# Patient Record
Sex: Female | Born: 1960 | Hispanic: Refuse to answer | Marital: Married | Smoking: Never smoker
Health system: Southern US, Community
[De-identification: ages and names within clinical notes are randomized; demographics above are authoritative.]

## PROBLEM LIST (undated history)

## (undated) DIAGNOSIS — L509 Urticaria, unspecified: Secondary | ICD-10-CM

## (undated) DIAGNOSIS — I700 Atherosclerosis of aorta: Secondary | ICD-10-CM

## (undated) DIAGNOSIS — R002 Palpitations: Secondary | ICD-10-CM

## (undated) DIAGNOSIS — R252 Cramp and spasm: Secondary | ICD-10-CM

## (undated) DIAGNOSIS — K760 Fatty (change of) liver, not elsewhere classified: Secondary | ICD-10-CM

## (undated) DIAGNOSIS — K279 Peptic ulcer, site unspecified, unspecified as acute or chronic, without hemorrhage or perforation: Secondary | ICD-10-CM

## (undated) DIAGNOSIS — I10 Essential (primary) hypertension: Secondary | ICD-10-CM

## (undated) HISTORY — DX: Fatty (change of) liver, not elsewhere classified: K76.0

## (undated) HISTORY — DX: Palpitations: R00.2

## (undated) HISTORY — DX: Atherosclerosis of aorta: I70.0

## (undated) HISTORY — DX: Urticaria, unspecified: L50.9

## (undated) HISTORY — DX: Essential (primary) hypertension: I10

## (undated) HISTORY — PX: TUBAL LIGATION: SHX77

## (undated) HISTORY — DX: Peptic ulcer, site unspecified, unspecified as acute or chronic, without hemorrhage or perforation: K27.9

## (undated) HISTORY — DX: Cramp and spasm: R25.2

---

## 2016-06-29 DIAGNOSIS — B029 Zoster without complications: Secondary | ICD-10-CM | POA: Insufficient documentation

## 2016-06-29 HISTORY — DX: Zoster without complications: B02.9

## 2020-04-20 ENCOUNTER — Other Ambulatory Visit: Payer: Self-pay | Admitting: Family

## 2020-04-20 DIAGNOSIS — I10 Essential (primary) hypertension: Secondary | ICD-10-CM

## 2020-04-20 DIAGNOSIS — Z8249 Family history of ischemic heart disease and other diseases of the circulatory system: Secondary | ICD-10-CM

## 2020-09-12 ENCOUNTER — Ambulatory Visit (INDEPENDENT_AMBULATORY_CARE_PROVIDER_SITE_OTHER): Payer: BC Managed Care – PPO | Admitting: Allergy and Immunology

## 2020-09-12 ENCOUNTER — Encounter: Payer: Self-pay | Admitting: Allergy and Immunology

## 2020-09-12 ENCOUNTER — Other Ambulatory Visit: Payer: Self-pay

## 2020-09-12 VITALS — BP 152/90 | HR 91 | Resp 18 | Ht 67.0 in | Wt 239.0 lb

## 2020-09-12 DIAGNOSIS — J3089 Other allergic rhinitis: Secondary | ICD-10-CM | POA: Diagnosis not present

## 2020-09-12 DIAGNOSIS — R0602 Shortness of breath: Secondary | ICD-10-CM

## 2020-09-12 DIAGNOSIS — J301 Allergic rhinitis due to pollen: Secondary | ICD-10-CM | POA: Diagnosis not present

## 2020-09-12 NOTE — Progress Notes (Signed)
[place] - [place] - [place] - [place] - [place]   Dear Dr. Sudie Bailey,  Thank you for referring Whitney Roberts to the [facility] [place] on 09/12/2020.   Below is a summation of this patient's evaluation and recommendations.  Thank you for your referral. I will keep you informed about this patient's response to treatment.   If you have any questions please do not hesitate to contact me.   Sincerely,  Jessica Priest, MD Allergy / Immunology [facility]   ______________________________________________________________________    NEW PATIENT NOTE  Referring Provider: Philemon Kingdom, MD Primary Provider: Philemon Kingdom, MD Date of office visit: 09/12/2020    Subjective:   Chief Complaint:  Whitney Roberts (DOB: 1961-05-09) is a 59 y.o. female who presents to the clinic on 09/12/2020 with a chief complaint of Allergic Rhinitis  .     HPI: Katya presents to this clinic in evaluation of allergic disease.  She has a multiyear history of having spring and fall problems with nasal congestion and sneezing and ears clogged up and runny nose that appears to occur when locating to the outdoors and inhaling pollen.  This year her pattern changed and she developed very significant problems throughout most of the year requiring the administration of 3 systemic steroids.  Every time she received a systemic steroid it did appear to work well but then she would relapse within a short period in time.  She was given Dymista but she only uses these medications when she develops a flareup.  She was given montelukast and developed CNS side effects including panic attacks and ringing in her ears.  In addition, she developed shortness of breath this spring after having lots of pollen exposure.  She has never had this problem before and has never had this problem since.  She can have cold air  exposure with no difficulty and she can exercise without any difficulty.  She had an echocardiogram performed at that point in time which was apparently normal.  She contracted Covid in April 2020 without any long-term sequela and has received 3 Moderna Covid vaccines.  Past Medical History:  Diagnosis Date  . Urticaria     Past Surgical History:  Procedure Laterality Date  . TUBAL LIGATION      Allergies as of 09/12/2020   Not on File     Medication List      Azelastine-Fluticasone 137-50 MCG/ACT Susp Place 1 spray into both nostrils 2 (two) times daily.   olmesartan 40 MG tablet Commonly known as: BENICAR Take 40 mg by mouth daily.   omeprazole 20 MG capsule Commonly known as: PRILOSEC Take 20 mg by mouth 2 (two) times daily.       Review of systems negative except as noted in HPI / PMHx or noted below:  Review of Systems  Constitutional: Negative.   HENT: Negative.   Eyes: Negative.   Respiratory: Negative.   Cardiovascular: Negative.   Gastrointestinal: Negative.   Genitourinary: Negative.   Musculoskeletal: Negative.   Skin: Negative.   Neurological: Negative.   Endo/Heme/Allergies: Negative.   Psychiatric/Behavioral: Negative.     Family History  Problem Relation Age of Onset  . Heart disease Mother   . COPD Father     Social History   Socioeconomic History  . Marital status: Unknown    Spouse name: Not on file  . Number of children: Not on file  .  Years of education: Not on file  . Highest education level: Not on file  Occupational History  . Not on file  Tobacco Use  . Smoking status: Not on file  Substance and Sexual Activity  . Alcohol use: Not on file  . Drug use: Not on file  . Sexual activity: Not on file  Other Topics Concern  . Not on file  Social History Narrative  . Not on file    Environmental and Social history  Lives in a house with a dry environment, no animals located inside the household, hardwood in the bedroom  floor, plastic on the bed, no plastic on the pillow, no smoking ongoing with inside the household.      Objective:   Vitals:   09/12/20 0923  BP: (!) 152/90  Pulse: 91  Resp: 18  SpO2: 98%   Height: 5\' 7"  (170.2 cm) Weight: 239 lb (108.4 kg)  Physical Exam Constitutional:      Appearance: She is not diaphoretic.  HENT:     Head: Normocephalic.     Right Ear: Tympanic membrane, ear canal and external ear normal.     Left Ear: Tympanic membrane, ear canal and external ear normal.     Nose: Nose normal. No mucosal edema or rhinorrhea.     Mouth/Throat:     Pharynx: Uvula midline. No oropharyngeal exudate.  Eyes:     Conjunctiva/sclera: Conjunctivae normal.  Neck:     Thyroid: No thyromegaly.     Trachea: Trachea normal. No tracheal tenderness or tracheal deviation.  Cardiovascular:     Rate and Rhythm: Normal rate and regular rhythm.     Heart sounds: Normal heart sounds, S1 normal and S2 normal. No murmur heard.   Pulmonary:     Effort: No respiratory distress.     Breath sounds: Normal breath sounds. No stridor. No wheezing or rales.  Lymphadenopathy:     Head:     Right side of head: No tonsillar adenopathy.     Left side of head: No tonsillar adenopathy.     Cervical: No cervical adenopathy.  Skin:    Findings: No erythema or rash.     Nails: There is no clubbing.  Neurological:     Mental Status: She is alert.     Diagnostics: Allergy skin tests were performed.  She demonstrated hypersensitivity to house dust mite, cat, dog, and springtime pollen including cedar tree.  Spirometry was performed and demonstrated an FEV1 of 2.42 @ 102 % of predicted. FEV1/FVC = 0.90  The patient had an Asthma Control Test with the following results:  .    Results of an echocardiogram obtained 09 February 2020 identified the following:  1. The left ventricle is normal in size with mildly to moderately increased wall thickness. 2. The left ventricular systolic function is  normal, LVEF is visually estimated at 60-65%. 3. The right ventricle is normal in size, with normal systolic function. 4. There is no pulmonary hypertension, estimated pulmonary artery systolic pressure is 27 mmHg.   Assessment and Plan:    1. Perennial allergic rhinitis   2. Seasonal allergic rhinitis due to pollen   3. Shortness of breath     1.  Allergen avoidance measures - dust mite, cat, dog, pollen  2.  Use Dymista 1 spray each nostril 1-2 times per day.  Can stretch out interval of use to 3 times per week.  Works best 11 February 2020.  3.  If needed:   A.  Nasal  saline  B.  Cetirizine 10 mg - 1 tablet 1 time per day  4.  Further evaluation for shortness of breath???  Yes, if recurrent  5.  Obtain fall flu vaccine  6.  Return to clinic in 6 months or earlier if problem  Autry appears to have atopic disease giving rise to her respiratory tract issues and will have her perform allergen avoidance measures best as possible and use some anti-inflammatory agents for her respiratory tract in a preventative manner.  She has had an episode of shortness of breath this past spring and she will require further evaluation for this issue if this is recurrent.  If she does well I will see her back in this clinic in 6 months which will be right in the middle of the springtime or earlier if there is a problem.  If she fails medical therapy she would be a candidate for immunotherapy.  Jessica Priest, MD Allergy / Immunology [facility] [place]

## 2020-09-12 NOTE — Patient Instructions (Addendum)
  1.  Allergen avoidance measures - dust mite, cat, dog, pollen  2.  Use Dymista 1 spray each nostril 1-2 times per day.  Can stretch out interval of use to 3 times per week.  Works best Water quality scientist.  3.  If needed:   A.  Nasal saline  B.  Cetirizine 10 mg - 1 tablet 1 time per day  4.  Further evaluation for shortness of breath???  Yes, if recurrent  5.  Obtain fall flu vaccine  6.  Return to clinic in 6 months or earlier if problem

## 2020-09-13 ENCOUNTER — Encounter: Payer: Self-pay | Admitting: Allergy and Immunology

## 2020-10-19 ENCOUNTER — Encounter: Payer: Self-pay | Admitting: *Deleted

## 2021-05-03 ENCOUNTER — Ambulatory Visit: Payer: BC Managed Care – PPO | Admitting: Allergy and Immunology

## 2021-05-03 ENCOUNTER — Encounter: Payer: Self-pay | Admitting: Allergy and Immunology

## 2021-05-03 ENCOUNTER — Other Ambulatory Visit: Payer: Self-pay

## 2021-05-03 VITALS — BP 128/98 | HR 84 | Resp 16 | Ht 66.0 in | Wt 238.0 lb

## 2021-05-03 DIAGNOSIS — J3089 Other allergic rhinitis: Secondary | ICD-10-CM | POA: Diagnosis not present

## 2021-05-03 DIAGNOSIS — H6982 Other specified disorders of Eustachian tube, left ear: Secondary | ICD-10-CM | POA: Diagnosis not present

## 2021-05-03 DIAGNOSIS — K219 Gastro-esophageal reflux disease without esophagitis: Secondary | ICD-10-CM

## 2021-05-03 DIAGNOSIS — J301 Allergic rhinitis due to pollen: Secondary | ICD-10-CM

## 2021-05-03 MED ORDER — OMEPRAZOLE 40 MG PO CPDR: DELAYED_RELEASE_CAPSULE | ORAL | 5 refills | Status: DC

## 2021-05-03 MED ORDER — FAMOTIDINE 40 MG PO TABS: 40.0000 mg | ORAL_TABLET | Freq: Every day | ORAL | 5 refills | Status: DC

## 2021-05-03 NOTE — Progress Notes (Signed)
[place] - [place] - [place] - [place] - [place]   Follow-up Note  Referring Provider: Philemon Kingdom, MD Primary Provider: Philemon Kingdom, MD Date of Office Visit: 05/03/2021  Subjective:   Whitney Roberts (DOB: 1960/11/14) is a 60 y.o. female who [facility] the [facility] on 05/03/2021 in re-evaluation of the following:  HPI: Whitney Roberts returns to this clinic in reevaluation of allergic rhinitis and a history of intermittent shortness of breath.  Her last visit to this clinic was her initial evaluation of 12 September 2020.  Overall she thought that she was doing very well regarding all of her respiratory tract issues but apparently she developed a side effect from the use of Dymista with a tachycardia and she discontinued this agent in March and relied on the use of Claritin twice a day.  She continued to do well but sometime at the end of May she developed a ear problem.  She feels as though her left ear was stopped up intermittently and she hears some popping on occasion.  She does not have any associated hearing loss but she does have a history of having vertigo for the past 2 years although fortunately she has not had any for at least the past year.  Apparently she had this issue very early in January 2022 and received a "steroid shot" which resolved this issue.  In addition, she has been having lots of intermittent raspy voice and throat clearing.  This also started about the end of May.  She does have reflux disease and she is treating this with omeprazole 20 mg twice a day.  She does not consume any caffeine or chocolate.  Her issues of intermittent shortness of breath that were occurring last year have not returned.  Allergies as of 05/03/2021       Reactions   Penicillins Hives   Aspirin Other (See Comments)   Other reaction(s): GI Bleed "bleeding ulcers'   Dymista [azelastine-fluticasone] Other (See Comments)   Increased heart rate    Montelukast Other (See Comments)   Ringing in ears   Prednisone Other (See Comments)   Trouble thinking        Medication List    CREAM BASE EX Apply topically. Bi-Est/Pro (50/50) 1/100mg /gm Cream   olmesartan 40 MG tablet Commonly known as: BENICAR Take 40 mg by mouth daily.   omeprazole 20 MG capsule Commonly known as: PRILOSEC Take 20 mg by mouth 2 (two) times daily.   VITAMIN C PO Take by mouth daily.   VITAMIN D3 PO Take by mouth daily.        Past Medical History:  Diagnosis Date   Urticaria     Past Surgical History:  Procedure Laterality Date   TUBAL LIGATION      Review of systems negative except as noted in HPI / PMHx or noted below:  Review of Systems  Constitutional: Negative.   HENT: Negative.    Eyes: Negative.   Respiratory: Negative.    Cardiovascular: Negative.   Gastrointestinal: Negative.   Genitourinary: Negative.   Musculoskeletal: Negative.   Skin: Negative.   Neurological: Negative.   Endo/Heme/Allergies: Negative.   Psychiatric/Behavioral: Negative.      Objective:   Vitals:   05/03/21 0823 05/03/21 0841  BP: (!) 164/94 (!) 128/98  Pulse:    Resp:    SpO2:     Height: 5\' 6"  (167.6 cm)  Weight: 238 lb (108 kg)   Physical Exam Constitutional:  Appearance: She is not diaphoretic.     Comments: Raspy voice, throat clearing  HENT:     Head: Normocephalic.     Right Ear: Tympanic membrane, ear canal and external ear normal.     Left Ear: Tympanic membrane, ear canal and external ear normal. Tympanic membrane has normal mobility.     Nose: Nose normal. No mucosal edema or rhinorrhea.     Mouth/Throat:     Pharynx: Uvula midline. No oropharyngeal exudate.  Eyes:     Conjunctiva/sclera: Conjunctivae normal.  Neck:     Thyroid: No thyromegaly.     Trachea: Trachea normal. No tracheal tenderness or tracheal deviation.  Cardiovascular:     Rate and Rhythm: Normal rate and regular rhythm.     Heart sounds: Normal  heart sounds, S1 normal and S2 normal. No murmur heard. Pulmonary:     Effort: No respiratory distress.     Breath sounds: Normal breath sounds. No stridor. No wheezing or rales.  Lymphadenopathy:     Head:     Right side of head: No tonsillar adenopathy.     Left side of head: No tonsillar adenopathy.     Cervical: No cervical adenopathy.  Skin:    Findings: No erythema or rash.     Nails: There is no clubbing.  Neurological:     Mental Status: She is alert.    Diagnostics: none  Assessment and Plan:   1. Perennial allergic rhinitis   2. Seasonal allergic rhinitis due to pollen   3. LPRD (laryngopharyngeal reflux disease)   4. Dysfunction of left eustachian tube     1.  Allergen avoidance measures - dust mite, cat, dog, pollen  2.  Use NASACORT 1 spray each nostril 1-2 times per day.  Can stretch out interval of use to 3 times per week.  Works best Water quality scientist.  3.  If needed:   A.  Nasal saline  B.  Cetirizine or loratadine 10 mg - 1 tablet 1-2 times per day  C.  Positive pressure inflation of ear  4.  Treat reflux / LPR:   A. INCREASE omeprazole 40 mg - 2 times per day  B. START famotidine 40 mg - 1 tablet in evening  C. Replace throat clearing with swallowing maneuver  5. Return to clinic in 1 month or earlier if problem  Whitney Roberts appears to have some inflammation of her airway giving rise to ETD and some laryngeal problems.  This is most likely on the basis of her reflux and will treat her for LPR by increasing her omeprazole dose and starting her on famotidine and have her eliminate throat clearing with a swallowing maneuver.  I will regroup with her in 1 month to assess her response to this approach.  For her ETD, which is probably triggered by her reflux disease, she can perform positive pressure inflation of her ear should it be required.  I recommended that she use some dose of nasal steroid even if it is just a few times per week given her atopic disease.   Fortunately, most of her atopic disease actually appears to be under really good control at this point.  [name], MD Allergy / Immunology [facility]

## 2021-05-03 NOTE — Patient Instructions (Addendum)
  1.  Allergen avoidance measures - dust mite, cat, dog, pollen  2.  Use NASACORT 1 spray each nostril 1-2 times per day.  Can stretch out interval of use to 3 times per week.  Works best Water quality scientist.  3.  If needed:   A.  Nasal saline  B.  Cetirizine or loratadine 10 mg - 1 tablet 1-2 times per day  C.  Positive pressure inflation of ear  4.  Treat reflux / LPR:   A. INCREASE omeprazole 40 mg - 2 times per day  B. START famotidine 40 mg - 1 tablet in evening  C. Replace throat clearing with swallowing maneuver  5. Return to clinic in 1 month or earlier if problem

## 2021-05-04 ENCOUNTER — Encounter: Payer: Self-pay | Admitting: Allergy and Immunology

## 2021-06-08 ENCOUNTER — Ambulatory Visit: Payer: BC Managed Care – PPO | Admitting: Allergy and Immunology

## 2021-06-08 ENCOUNTER — Other Ambulatory Visit: Payer: Self-pay

## 2021-06-08 ENCOUNTER — Encounter: Payer: Self-pay | Admitting: Allergy and Immunology

## 2021-06-08 VITALS — BP 118/72 | HR 83 | Resp 16

## 2021-06-08 DIAGNOSIS — H6982 Other specified disorders of Eustachian tube, left ear: Secondary | ICD-10-CM

## 2021-06-08 DIAGNOSIS — J301 Allergic rhinitis due to pollen: Secondary | ICD-10-CM

## 2021-06-08 DIAGNOSIS — K219 Gastro-esophageal reflux disease without esophagitis: Secondary | ICD-10-CM | POA: Diagnosis not present

## 2021-06-08 DIAGNOSIS — J3089 Other allergic rhinitis: Secondary | ICD-10-CM | POA: Diagnosis not present

## 2021-06-08 NOTE — Progress Notes (Signed)
[place] - [place] - [place] - [place] - [place]   Follow-up Note  Referring Provider: Philemon Kingdom, MD Primary Provider: Philemon Kingdom, MD Date of Office Visit: 06/08/2021  Subjective:   Whitney Roberts (DOB: [birth year]) is a 60 y.o. female who returns to the [facility] on 06/08/2021 in re-evaluation of the following:  HPI: Franca returns to this clinic in evaluation of allergic rhinitis and ETD and LPR.  Her last visit to this clinic was 03 May 2021.  During her last visit we focused on the treatment of LPR and she has significantly improved regarding her raspy voice and throat clearing and overall her throat just feels a lot better.  She has been consistently using a proton pump inhibitor and H2 receptor blocker and she has worked very hard to replace her throat clearing maneuvers with swallowing maneuvers.  Her ear is doing very well at this point in time.  Her airway is doing very well at this point in time.  She has been consistently using Nasacort a few times per week.  Allergies as of 06/08/2021       Reactions   Penicillins Hives   Aspirin Other (See Comments)   Other reaction(s): GI Bleed "bleeding ulcers'   Dymista [azelastine-fluticasone] Other (See Comments)   Increased heart rate   Montelukast Other (See Comments)   Ringing in ears   Prednisone Other (See Comments)   Trouble thinking        Medication List    CREAM BASE EX Apply topically. Bi-Est/Pro (50/50) 1/100mg /gm Cream   famotidine 40 MG tablet Commonly known as: PEPCID Take 1 tablet (40 mg total) by mouth at bedtime.   olmesartan 40 MG tablet Commonly known as: BENICAR Take 40 mg by mouth daily.   omeprazole 40 MG capsule Commonly known as: PRILOSEC Take one capsule by mouth twice daily as directed.   VITAMIN C PO Take by mouth daily.   VITAMIN D3 PO Take by mouth daily.    Past Medical History:  Diagnosis Date   Urticaria     Past  Surgical History:  Procedure Laterality Date   TUBAL LIGATION      Review of systems negative except as noted in HPI / PMHx or noted below:  Review of Systems  Constitutional: Negative.   HENT: Negative.    Eyes: Negative.   Respiratory: Negative.    Cardiovascular: Negative.   Gastrointestinal: Negative.   Genitourinary: Negative.   Musculoskeletal: Negative.   Skin: Negative.   Neurological: Negative.   Endo/Heme/Allergies: Negative.   Psychiatric/Behavioral: Negative.      Objective:   Vitals:   06/08/21 0847  BP: 118/72  Pulse: 83  Resp: 16  SpO2: 98%          Physical Exam Constitutional:      Appearance: She is not diaphoretic.  HENT:     Head: Normocephalic.     Right Ear: Tympanic membrane, ear canal and external ear normal.     Left Ear: Tympanic membrane, ear canal and external ear normal.     Nose: Nose normal. No mucosal edema or rhinorrhea.     Mouth/Throat:     Pharynx: Uvula midline. No oropharyngeal exudate.  Eyes:     Conjunctiva/sclera: Conjunctivae normal.  Neck:     Thyroid: No thyromegaly.     Trachea: Trachea normal. No tracheal tenderness or tracheal deviation.  Cardiovascular:     Rate and Rhythm: Normal rate and regular rhythm.  Heart sounds: Normal heart sounds, S1 normal and S2 normal. No murmur heard. Pulmonary:     Effort: No respiratory distress.     Breath sounds: Normal breath sounds. No stridor. No wheezing or rales.  Lymphadenopathy:     Head:     Right side of head: No tonsillar adenopathy.     Left side of head: No tonsillar adenopathy.     Cervical: No cervical adenopathy.  Skin:    Findings: No erythema or rash.     Nails: There is no clubbing.  Neurological:     Mental Status: She is alert.    Diagnostics: none  Assessment and Plan:   1. Perennial allergic rhinitis   2. Seasonal allergic rhinitis due to pollen   3. LPRD (laryngopharyngeal reflux disease)   4. Dysfunction of left eustachian tube      1.  Continue Allergen avoidance measures - dust mite, cat, dog, pollen  2.  Continue NASACORT 1 spray each nostril 3-7 times per week  3.  Continue to Treat reflux / LPR:   A. Omeprazole 40 mg - 2 times per day  B. Famotidine 40 mg - 1 tablet in evening  C. Replace throat clearing with swallowing maneuver  4. If needed:   A.  Nasal saline  B.  Cetirizine or loratadine 10 mg - 1 tablet 1-2 times per day  C.  Positive pressure inflation of ear  5. Return to clinic in December or earlier if problem. Taper medications???  6. Obtain fall flu vaccine  Kamile appears to be doing much better at this point in time while she utilizes therapy directed against respiratory tract and eustachian tube inflammation and also addresses her reflux induced respiratory disease.  We will keep her on the plan noted above until we see her back in this clinic in December and if she continues to do this well I think will be an opportunity to consolidate some of her treatment at that point.  [name], MD Allergy / Immunology [facility]

## 2021-06-08 NOTE — Patient Instructions (Addendum)
  1.  Continue Allergen avoidance measures - dust mite, cat, dog, pollen  2.  Continue NASACORT 1 spray each nostril 3-7 times per week  3.  Continue to Treat reflux / LPR:   A. Omeprazole 40 mg - 2 times per day  B. Famotidine 40 mg - 1 tablet in evening  C. Replace throat clearing with swallowing maneuver  4. If needed:   A.  Nasal saline  B.  Cetirizine or loratadine 10 mg - 1 tablet 1-2 times per day  C.  Positive pressure inflation of ear  5. Return to clinic in December or earlier if problem. Taper medications???  6. Obtain fall flu vaccine

## 2021-06-12 ENCOUNTER — Encounter: Payer: Self-pay | Admitting: Allergy and Immunology

## 2021-07-17 ENCOUNTER — Telehealth: Payer: Self-pay | Admitting: Allergy and Immunology

## 2021-07-17 NOTE — Telephone Encounter (Signed)
Patient states ever since she started to take Famotidine she's felt really bad. She gets severe ringing in her ears, feels very nervous like an anxiety attack is about to come on, nauseous, and can't sleep at night. She knows these symptoms are coming from this medication because when she doesn't take it she feels fine. She wanted to let Dr. Lucie Leather know of her symptoms and what he suggests before she stops taking it completely.

## 2021-07-17 NOTE — Telephone Encounter (Signed)
Please let Whitney Roberts know that she can either use half a tablet or stop famotidine completely.  We do not want to create another problem with her therapy for LPR.

## 2021-07-17 NOTE — Telephone Encounter (Signed)
Please advice Dr. Lucie Leather

## 2021-07-18 NOTE — Telephone Encounter (Signed)
She will completely stop Famotidine but continue her Omeprazole twice a day.

## 2021-07-18 NOTE — Telephone Encounter (Signed)
Informed patient of Dr. Kathyrn Lass message. Patient verbalized understanding.

## 2021-07-18 NOTE — Telephone Encounter (Signed)
Left a message for return call.  

## 2021-10-09 ENCOUNTER — Ambulatory Visit: Payer: BC Managed Care – PPO | Admitting: Allergy and Immunology

## 2021-10-09 ENCOUNTER — Other Ambulatory Visit: Payer: Self-pay

## 2021-10-09 ENCOUNTER — Encounter: Payer: Self-pay | Admitting: Allergy and Immunology

## 2021-10-09 VITALS — BP 122/78 | HR 80 | Resp 12

## 2021-10-09 DIAGNOSIS — K219 Gastro-esophageal reflux disease without esophagitis: Secondary | ICD-10-CM

## 2021-10-09 DIAGNOSIS — J3089 Other allergic rhinitis: Secondary | ICD-10-CM | POA: Diagnosis not present

## 2021-10-09 DIAGNOSIS — J301 Allergic rhinitis due to pollen: Secondary | ICD-10-CM

## 2021-10-09 DIAGNOSIS — H6982 Other specified disorders of Eustachian tube, left ear: Secondary | ICD-10-CM | POA: Diagnosis not present

## 2021-10-09 NOTE — Progress Notes (Signed)
[place] [facility] [place] [facility] [place] [facility] [place] [facility]   Follow-up Note  Referring Provider: Philemon Kingdom, MD Primary Provider: Philemon Kingdom, MD Date of Office Visit: 10/09/2021  Subjective:   Whitney Roberts (DOB: 1960/12/09) is a 60 y.o. female who [facility] in re-evaluation of the following:  HPI: Whitney Roberts returns to this clinic in evaluation of LPR, allergic rhinitis, and ETD.  Her last visit to this clinic was 08 June 2021.  Once again she has had excellent control of her LPR and has basically resolved all of her guttural maneuvers and cough.  She was able to taper off her H2 receptor blocker over the course of the past month and still does well.  She continues to use omeprazole consistently.  She has lost 8 pounds of weight.  She has had very little problems with her nose while using a nasal steroid.  Sometimes the Nasacort does cause her headache.  She has been using Nasacort a few times per week.  Her ear is doing okay.  About 3 times per week she still has 5 minutes of crackling noise in her left ear.  She does not have any associated hearing loss and dizziness.  This pattern has been stable for 15 years at which point in time she did visit with an ENT doctor.  Allergies as of 10/09/2021       Reactions   Penicillins Hives   Aspirin Other (See Comments)   Other reaction(s): GI Bleed "bleeding ulcers'   Dymista [azelastine-fluticasone] Other (See Comments)   Increased heart rate   Montelukast Other (See Comments)   Ringing in ears   Prednisone Other (See Comments)   Trouble thinking        Medication List    CREAM BASE EX Apply topically. Bi-Est/Pro (50/50) 1/100mg /gm Cream   olmesartan 40 MG tablet Commonly known as: BENICAR Take 40 mg by mouth daily.   omeprazole 40 MG capsule Commonly known as: PRILOSEC Take one capsule by mouth twice daily as directed.   PROBIOTIC DAILY PO Take by  mouth daily.   VITAMIN C PO Take by mouth daily.   VITAMIN D3 PO Take by mouth daily.    Past Medical History:  Diagnosis Date   Urticaria     Past Surgical History:  Procedure Laterality Date   TUBAL LIGATION      Review of systems negative except as noted in HPI / PMHx or noted below:  Review of Systems  Constitutional: Negative.   HENT: Negative.    Eyes: Negative.   Respiratory: Negative.    Cardiovascular: Negative.   Gastrointestinal: Negative.   Genitourinary: Negative.   Musculoskeletal: Negative.   Skin: Negative.   Neurological: Negative.   Endo/Heme/Allergies: Negative.   Psychiatric/Behavioral: Negative.      Objective:   Vitals:   10/09/21 0856  BP: 122/78  Pulse: 80  Resp: 12  SpO2: 97%          Physical Exam Constitutional:      Appearance: She is not diaphoretic.  HENT:     Head: Normocephalic.     Right Ear: Tympanic membrane, ear canal and external ear normal.     Left Ear: Tympanic membrane, ear canal and external ear normal.     Nose: Nose normal. No mucosal edema or rhinorrhea.     Mouth/Throat:     Pharynx: Uvula midline. No oropharyngeal exudate.  Eyes:     Conjunctiva/sclera: Conjunctivae normal.  Neck:     Thyroid: No thyromegaly.     Trachea: Trachea normal. No tracheal tenderness or tracheal deviation.  Cardiovascular:     Rate and Rhythm: Normal rate and regular rhythm.     Heart sounds: Normal heart sounds, S1 normal and S2 normal. No murmur heard. Pulmonary:     Effort: No respiratory distress.     Breath sounds: Normal breath sounds. No stridor. No wheezing or rales.  Lymphadenopathy:     Head:     Right side of head: No tonsillar adenopathy.     Left side of head: No tonsillar adenopathy.     Cervical: No cervical adenopathy.  Skin:    Findings: No erythema or rash.     Nails: There is no clubbing.  Neurological:     Mental Status: She is alert.    Diagnostics: none  Assessment and Plan:   1. LPRD  (laryngopharyngeal reflux disease)   2. Seasonal allergic rhinitis due to pollen   3. Perennial allergic rhinitis   4. Dysfunction of left eustachian tube     1.  Continue Allergen avoidance measures - dust mite, cat, dog, pollen  2.  Continue RHINOCORT / FLONASE SENSIMIST 1 spray each nostril 3-7 times per week (DISCONTINUE Nasacort)  3.  Continue to Treat reflux / LPR:   A. Omeprazole 40 mg - 2 times per day  B. Famotidine 40 mg - 1 tablet in evening IF NEEDED   4. If needed:   A.  Nasal saline  B.  Cetirizine or loratadine 10 mg - 1 tablet 1-2 times per day  C.  Positive pressure inflation of ear  5. Return to clinic in 1 year or earlier if problem  Whitney Roberts is really doing quite well and she will continue on therapy directed against LPR which at this point in time includes the use of omeprazole.  And she can use a nasal steroid.  Because Nasacort was giving her some headache she can use Rhinocort or Flonase Sensimist.  Assuming she does well with the plan noted above I will see her back in this clinic in 1 year or earlier if there is a problem.  On another note, I did have a long discussion with her today about her husband who apparently has untreated sleep apnea and she is attempting to get a sleep study completed for him but unfortunately he is not very interested in having that study completed.  I have asked her to discuss with Dr. Sudie Bailey about getting that study ordered for her husband.  [name], MD Allergy / Immunology [facility]

## 2021-10-09 NOTE — Patient Instructions (Signed)
  1.  Continue Allergen avoidance measures - dust mite, cat, dog, pollen  2.  Continue RHINOCORT / FLONASE SENSIMIST 1 spray each nostril 3-7 times per week (DISCONTINUE Nasacort)  3.  Continue to Treat reflux / LPR:   A. Omeprazole 40 mg - 2 times per day  B. Famotidine 40 mg - 1 tablet in evening IF NEEDED   4. If needed:   A.  Nasal saline  B.  Cetirizine or loratadine 10 mg - 1 tablet 1-2 times per day  C.  Positive pressure inflation of ear  5. Return to clinic in 1 year or earlier if problem

## 2021-10-10 ENCOUNTER — Encounter: Payer: Self-pay | Admitting: Allergy and Immunology

## 2021-11-14 ENCOUNTER — Other Ambulatory Visit: Payer: Self-pay

## 2021-11-14 DIAGNOSIS — K279 Peptic ulcer, site unspecified, unspecified as acute or chronic, without hemorrhage or perforation: Secondary | ICD-10-CM | POA: Insufficient documentation

## 2021-11-14 DIAGNOSIS — N950 Postmenopausal bleeding: Secondary | ICD-10-CM

## 2021-11-14 DIAGNOSIS — I700 Atherosclerosis of aorta: Secondary | ICD-10-CM | POA: Insufficient documentation

## 2021-11-14 DIAGNOSIS — K760 Fatty (change of) liver, not elsewhere classified: Secondary | ICD-10-CM | POA: Insufficient documentation

## 2021-11-14 DIAGNOSIS — R7303 Prediabetes: Secondary | ICD-10-CM | POA: Insufficient documentation

## 2021-11-14 DIAGNOSIS — L509 Urticaria, unspecified: Secondary | ICD-10-CM | POA: Insufficient documentation

## 2021-11-14 DIAGNOSIS — R55 Syncope and collapse: Secondary | ICD-10-CM | POA: Insufficient documentation

## 2021-11-14 DIAGNOSIS — I10 Essential (primary) hypertension: Secondary | ICD-10-CM | POA: Insufficient documentation

## 2021-11-14 DIAGNOSIS — R252 Cramp and spasm: Secondary | ICD-10-CM | POA: Insufficient documentation

## 2021-11-14 DIAGNOSIS — R002 Palpitations: Secondary | ICD-10-CM | POA: Insufficient documentation

## 2021-11-14 HISTORY — DX: Essential (primary) hypertension: I10

## 2021-11-14 HISTORY — DX: Postmenopausal bleeding: N95.0

## 2021-11-14 HISTORY — DX: Syncope and collapse: R55

## 2021-11-14 HISTORY — DX: Prediabetes: R73.03

## 2021-12-14 ENCOUNTER — Ambulatory Visit: Payer: BC Managed Care – PPO | Admitting: Cardiology

## 2021-12-14 ENCOUNTER — Ambulatory Visit (INDEPENDENT_AMBULATORY_CARE_PROVIDER_SITE_OTHER): Payer: BC Managed Care – PPO

## 2021-12-14 ENCOUNTER — Other Ambulatory Visit: Payer: Self-pay

## 2021-12-14 ENCOUNTER — Encounter: Payer: Self-pay | Admitting: Cardiology

## 2021-12-14 VITALS — BP 148/86 | HR 91 | Ht 69.0 in | Wt 237.2 lb

## 2021-12-14 DIAGNOSIS — I700 Atherosclerosis of aorta: Secondary | ICD-10-CM

## 2021-12-14 DIAGNOSIS — I10 Essential (primary) hypertension: Secondary | ICD-10-CM | POA: Diagnosis not present

## 2021-12-14 DIAGNOSIS — E669 Obesity, unspecified: Secondary | ICD-10-CM

## 2021-12-14 DIAGNOSIS — R079 Chest pain, unspecified: Secondary | ICD-10-CM

## 2021-12-14 DIAGNOSIS — R002 Palpitations: Secondary | ICD-10-CM

## 2021-12-14 HISTORY — DX: Chest pain, unspecified: R07.9

## 2021-12-14 HISTORY — DX: Obesity, unspecified: E66.9

## 2021-12-14 NOTE — Patient Instructions (Signed)
Medication Instructions:  Your physician recommends that you continue on your current medications as directed. Please refer to the Current Medication list given to you today.  *If you need a refill on your cardiac medications before your next appointment, please call your pharmacy*   Lab Work: None If you have labs (blood work) drawn today and your tests are completely normal, you will receive your results only by: MyChart Message (if you have MyChart) OR A paper copy in the mail If you have any lab test that is abnormal or we need to change your treatment, we will call you to review the results.   Testing/Procedures: A zio monitor was ordered today. It will remain on for 14 days. You will then return monitor and event diary in provided box. It takes 1-2 weeks for report to be downloaded and returned to [place]. We will call you with the results. If monitor falls off or has orange flashing light, please call Zio for further instructions.   Your physician has requested that you have an echocardiogram. Echocardiography is a painless test that uses sound waves to create images of your heart. It provides your doctor with information about the size and shape of your heart and how well your hearts chambers and valves are working. This procedure takes approximately one hour. There are no restrictions for this procedure.  We will order CT coronary calcium score. It will cost $99.00 and is not covered by insurance.  Please call [phone] to schedule.   [facility]  [address]  [place], [place] [address]     Follow-Up: At [facility], you and your health needs are our priority.  As part of our continuing mission to provide you with exceptional heart care, we have created designated Provider Care Teams.  These Care Teams include your primary Cardiologist (physician) and Advanced Practice Providers (APPs -  Physician Assistants and Nurse Practitioners) who all work together to  provide you with the care you need, when you need it.  We recommend signing up for the patient portal called "MyChart".  Sign up information is provided on this After Visit Summary.  MyChart is used to connect with patients for Virtual Visits (Telemedicine).  Patients are able to view lab/test results, encounter notes, upcoming appointments, etc.  Non-urgent messages can be sent to your provider as well.   To learn more about what you can do with MyChart, go to [email or web address].    Your next appointment:   2 month(s)  The format for your next appointment:   In Person  Provider:   Belva Crome, MD    Other Instructions None

## 2021-12-14 NOTE — Progress Notes (Signed)
Cardiology Office Note:    Date:  12/14/2021   ID:  Whitney Roberts, DOB 1961-07-24, MRN [id number]  PCP:  Ernestene Kiel, MD  Cardiologist:  Jenean Lindau, MD   Referring MD: Ernestene Kiel, MD    ASSESSMENT:    1. Essential hypertension   2. Aortic atherosclerosis (HCC)   3. Palpitations   4. Chest pain, unspecified type   5. Obesity (BMI 35.0-39.9 without comorbidity)    PLAN:    In order of problems listed above:  Primary prevention stressed with the patient.  Importance of compliance with diet medication stressed and she vocalized understanding. Chest pain: Atypical in nature however in view of risk factors we will do an exercise stress echo. Essential hypertension: Blood pressure stable she has an element of whitecoat hypertension.  She checks her blood pressures at home and mentioned to me the numbers and they are fine.  She will keep a track of it.  Lifestyle modification and diet was emphasized and she promises to do better. Mixed dyslipidemia: Followed by primary care.  There is also history of aortic atherosclerosis though I do not have documentation for it at this time.  This will be followed by primary care. Cardiac murmur: Echocardiogram will be done to assess murmur heard on auscultation. Risk stratification for coronary artery disease: I discussed calcium scoring and she is agreeable. Palpitations: They do not appear to be very significant.  No dizziness or any syncopal spells.  We will do a 2-week monitor to understand this. Patient will be seen in follow-up appointment in 6 months or earlier if the patient has any concerns.  She knows to go to the nearest emergency room for any concerning symptoms.   Medication Adjustments/Labs and Tests Ordered: Current medicines are reviewed at length with the patient today.  Concerns regarding medicines are outlined above.  No orders of the defined types were placed in this encounter.  No orders of the defined  types were placed in this encounter.    History of Present Illness:    Whitney Roberts is a 61 y.o. female who is being seen today for the evaluation of chest pain and palpitations at the request of Ernestene Kiel, MD. patient is a pleasant 61 year old female.  She has past medical history of essential hypertension, mild dyslipidemia and palpitations.  Her elevated heart rate concerns are.  She denies any chest pain orthopnea or PND.  She occasionally gets chest tightness like symptoms and these are not related to exertion.  No radiating to the neck or to the arms.  She is on high otherwise active lady.  At the time of my evaluation, the patient is alert awake oriented and in no distress.  Past Medical History:  Diagnosis Date   Aortic atherosclerosis (HCC)    Cramp and spasm    Essential hypertension 11/14/2021   Fatty liver    Herpes zoster 06/29/2016   Hypertension    Palpitations    Peptic ulcer disease    Postmenopausal bleeding 11/14/2021   Prediabetes 11/14/2021   Urticaria    Vasovagal syncope 11/14/2021    Past Surgical History:  Procedure Laterality Date   TUBAL LIGATION      Current Medications: Current Meds  Medication Sig   Ascorbic Acid (VITAMIN C PO) Take 1 tablet by mouth daily.   Cholecalciferol (VITAMIN D3 PO) Take 3,000 Units by mouth daily.   CREAM BASE EX Apply 1 application topically daily. Bi-Est/Pro (50/50) 1/100mg /gm Cream   olmesartan (BENICAR)  40 MG tablet Take 40 mg by mouth daily.   omeprazole (PRILOSEC) 40 MG capsule Take one capsule by mouth twice daily as directed.   Probiotic Product (PROBIOTIC DAILY PO) Take 1 capsule by mouth daily.     Allergies:   Penicillins, Aspirin, Dymista [azelastine-fluticasone], Montelukast, and Prednisone   Social History   Socioeconomic History   Marital status: Married    Spouse name: Not on file   Number of children: Not on file   Years of education: Not on file   Highest education level: Not on file   Occupational History   Not on file  Tobacco Use   Smoking status: Never   Smokeless tobacco: Never  Substance and Sexual Activity   Alcohol use: Not Currently   Drug use: Never   Sexual activity: Not on file  Other Topics Concern   Not on file  Social History Narrative   Not on file   Social Determinants of Health   Financial Resource Strain: Not on file  Food Insecurity: Not on file  Transportation Needs: Not on file  Physical Activity: Not on file  Stress: Not on file  Social Connections: Not on file     Family History: The patient's family history includes COPD in her father; Heart disease in her mother.  ROS:   Please see the history of present illness.    All other systems reviewed and are negative.  EKGs/Labs/Other Studies Reviewed:    The following studies were reviewed today: EKG reveals sinus rhythm and nonspecific ST-T changes   Recent Labs: No results found for requested labs within last 8760 hours.  Recent Lipid Panel No results found for: CHOL, TRIG, HDL, CHOLHDL, VLDL, LDLCALC, LDLDIRECT  Physical Exam:    VS:  BP (!) 148/86    Pulse 91    Ht 5\' 9"  (1.753 m)    Wt 237 lb 3.2 oz (107.6 kg)    SpO2 97%    BMI 35.03 kg/m     Wt Readings from Last 3 Encounters:  12/14/21 237 lb 3.2 oz (107.6 kg)  05/03/21 238 lb (108 kg)  09/12/20 239 lb (108.4 kg)     GEN: Patient is in no acute distress HEENT: Normal NECK: No JVD; No carotid bruits LYMPHATICS: No lymphadenopathy CARDIAC: S1 S2 regular, 2/6 systolic murmur at the apex. RESPIRATORY:  Clear to auscultation without rales, wheezing or rhonchi  ABDOMEN: Soft, non-tender, non-distended MUSCULOSKELETAL:  No edema; No deformity  SKIN: Warm and dry NEUROLOGIC:  Alert and oriented x 3 PSYCHIATRIC:  Normal affect    Signed, Jenean Lindau, MD  12/14/2021 10:08 AM    [place]

## 2021-12-14 NOTE — Addendum Note (Signed)
Addended by: Roxanne Mins I on: 12/14/2021 11:16 AM   Modules accepted: Orders

## 2021-12-19 ENCOUNTER — Ambulatory Visit (INDEPENDENT_AMBULATORY_CARE_PROVIDER_SITE_OTHER): Payer: BC Managed Care – PPO

## 2021-12-19 ENCOUNTER — Other Ambulatory Visit: Payer: Self-pay

## 2021-12-19 DIAGNOSIS — R011 Cardiac murmur, unspecified: Secondary | ICD-10-CM | POA: Diagnosis not present

## 2021-12-19 DIAGNOSIS — R079 Chest pain, unspecified: Secondary | ICD-10-CM | POA: Diagnosis not present

## 2021-12-19 DIAGNOSIS — R002 Palpitations: Secondary | ICD-10-CM

## 2021-12-19 DIAGNOSIS — I700 Atherosclerosis of aorta: Secondary | ICD-10-CM

## 2021-12-19 DIAGNOSIS — I10 Essential (primary) hypertension: Secondary | ICD-10-CM | POA: Diagnosis not present

## 2021-12-19 DIAGNOSIS — E669 Obesity, unspecified: Secondary | ICD-10-CM

## 2021-12-19 LAB — ECHOCARDIOGRAM COMPLETE
Area-P 1/2: 4.06 cm2
S' Lateral: 2.4 cm

## 2021-12-28 DIAGNOSIS — R079 Chest pain, unspecified: Secondary | ICD-10-CM | POA: Diagnosis not present

## 2022-01-08 ENCOUNTER — Other Ambulatory Visit: Payer: Self-pay

## 2022-01-08 ENCOUNTER — Ambulatory Visit: Payer: BC Managed Care – PPO | Admitting: Allergy and Immunology

## 2022-01-08 ENCOUNTER — Encounter: Payer: Self-pay | Admitting: Allergy and Immunology

## 2022-01-08 VITALS — BP 152/86 | HR 84 | Resp 16 | Ht 67.0 in | Wt 242.0 lb

## 2022-01-08 DIAGNOSIS — J3089 Other allergic rhinitis: Secondary | ICD-10-CM

## 2022-01-08 DIAGNOSIS — K219 Gastro-esophageal reflux disease without esophagitis: Secondary | ICD-10-CM

## 2022-01-08 DIAGNOSIS — J301 Allergic rhinitis due to pollen: Secondary | ICD-10-CM

## 2022-01-08 DIAGNOSIS — H6982 Other specified disorders of Eustachian tube, left ear: Secondary | ICD-10-CM

## 2022-01-08 NOTE — Progress Notes (Unsigned)
[place] - [place] [facility] [place] [facility] [place] -    Follow-up Note  Referring Provider: Philemon Kingdom, MD Primary Provider: Philemon Kingdom, MD Date of Office Visit: 01/08/2022  Subjective:   Whitney Roberts (DOB: 08-26-1961) is a 61 y.o. female who [facility] 01[facility]/2022 in re-evaluation of the following:  HPI: Terah returns to this clinic in reevaluation of allergic rhinitis, ETD, and LPR.  Her last visit to this clinic was 09 October 2021.  Overall she feels as though she has done relatively well since her last visit.  She has not had any issues requiring her to use a systemic steroid or antibiotic for either her nose or her ear problem.  Her ear sometimes feels a little bit painful and aches but she can usually cleared with positive pressure inflation.  She continues to use a nasal steroid on a consistent basis.  Her reflux is under very good control as is her throat problem as long as she remains on omeprazole.  She has no need to use any montelukast at this point in time.  She is moving to [employer] later this year.  Allergies as of 01/08/2022       Reactions   Penicillins Hives   Aspirin Other (See Comments)   Other reaction(s): GI Bleed "bleeding ulcers'   Dymista [azelastine-fluticasone] Other (See Comments)   Increased heart rate   Montelukast Other (See Comments)   Ringing in ears   Prednisone Other (See Comments)   Trouble thinking        Medication List    CREAM BASE EX Apply 1 application topically daily. Bi-Est/Pro (50/50) 1/100mg /gm Cream   famotidine 40 MG tablet Commonly known as: PEPCID Take 40 mg by mouth daily.   Flonase Sensimist 27.5 MCG/SPRAY nasal spray Generic drug: fluticasone Place 2 sprays into the nose daily as needed.   olmesartan 40 MG tablet Commonly known as: BENICAR Take 40 mg by mouth daily.   omeprazole 40 MG capsule Commonly known as: PRILOSEC Take one capsule by  mouth twice daily as directed.   PROBIOTIC DAILY PO Take 1 capsule by mouth daily.   VITAMIN C PO Take 1 tablet by mouth daily.   VITAMIN D3 PO Take 3,000 Units by mouth daily.    Past Medical History:  Diagnosis Date   Aortic atherosclerosis (HCC)    Cramp and spasm    Essential hypertension 11/14/2021   Fatty liver    Herpes zoster 06/29/2016   Hypertension    Palpitations    Peptic ulcer disease    Postmenopausal bleeding 11/14/2021   Prediabetes 11/14/2021   Urticaria    Vasovagal syncope 11/14/2021    Past Surgical History:  Procedure Laterality Date   TUBAL LIGATION      Review of systems negative except as noted in HPI / PMHx or noted below:  Review of Systems  Constitutional: Negative.   HENT: Negative.    Eyes: Negative.   Respiratory: Negative.    Cardiovascular: Negative.   Gastrointestinal: Negative.   Genitourinary: Negative.   Musculoskeletal: Negative.   Skin: Negative.   Neurological: Negative.   Endo/Heme/Allergies: Negative.   Psychiatric/Behavioral: Negative.      Objective:   There were no vitals filed for this visit.        Physical Exam Constitutional:      Appearance: She is not diaphoretic.  HENT:     Head: Normocephalic.     Right Ear: Tympanic membrane, ear canal  and external ear normal.     Left Ear: Tympanic membrane, ear canal and external ear normal.     Nose: Nose normal. No mucosal edema or rhinorrhea.     Mouth/Throat:     Pharynx: Uvula midline. No oropharyngeal exudate.  Eyes:     Conjunctiva/sclera: Conjunctivae normal.  Neck:     Thyroid: No thyromegaly.     Trachea: Trachea normal. No tracheal tenderness or tracheal deviation.  Cardiovascular:     Rate and Rhythm: Normal rate and regular rhythm.     Heart sounds: Normal heart sounds, S1 normal and S2 normal. No murmur heard. Pulmonary:     Effort: No respiratory distress.     Breath sounds: Normal breath sounds. No stridor. No wheezing or rales.   Lymphadenopathy:     Head:     Right side of head: No tonsillar adenopathy.     Left side of head: No tonsillar adenopathy.     Cervical: No cervical adenopathy.  Skin:    Findings: No erythema or rash.     Nails: There is no clubbing.  Neurological:     Mental Status: She is alert.    Diagnostics: none  Assessment and Plan:   1. Perennial allergic rhinitis   2. Seasonal allergic rhinitis due to pollen   3. Dysfunction of left eustachian tube   4. LPRD (laryngopharyngeal reflux disease)    1.  Continue Allergen avoidance measures - dust mite, cat, dog, pollen  2.  Continue  FLONASE SENSIMIST 1-2 sprays each nostril 3-7 times per week   3.  Continue to Treat reflux / LPR:   A. Omeprazole 40 mg - 2 times per day  4. If needed:   A.  Nasal saline  B.  Loratadine 10 mg - 1 tablet 1-2 times per day  C.  Positive pressure inflation of ear  5. Return to clinic in 1 year or earlier if problem  Kamyia appears to be doing very well on her current therapy which includes some anti-inflammatory medications for her upper airway, allergen avoidance measures, and the use of omeprazole.  Assuming she continues to do well with this plan I will see her back in this clinic in 1 year or earlier if there is a problem.  [name], MD Allergy / Immunology [facility]

## 2022-01-08 NOTE — Patient Instructions (Addendum)
?  1.  Continue Allergen avoidance measures - dust mite, cat, dog, pollen ? ?2.  Continue  FLONASE SENSIMIST 1-2 sprays each nostril 3-7 times per week  ? ?3.  Continue to Treat reflux / LPR: ? ? A. Omeprazole 40 mg - 2 times per day ? ?4. If needed: ? ? A.  Nasal saline ? B.  Loratadine 10 mg - 1 tablet 1-2 times per day ? C.  Positive pressure inflation of ear ? ?5. Return to clinic in 1 year or earlier if problem ?

## 2022-01-09 ENCOUNTER — Encounter: Payer: Self-pay | Admitting: Allergy and Immunology

## 2022-01-10 ENCOUNTER — Other Ambulatory Visit: Payer: Self-pay

## 2022-01-10 ENCOUNTER — Ambulatory Visit (HOSPITAL_COMMUNITY)
Admission: RE | Admit: 2022-01-10 | Discharge: 2022-01-10 | Disposition: A | Discharge status: Home / Self Care | Payer: Self-pay | Source: Ambulatory Visit | Attending: Cardiology | Admitting: Cardiology

## 2022-01-10 DIAGNOSIS — R079 Chest pain, unspecified: Secondary | ICD-10-CM | POA: Insufficient documentation

## 2022-01-17 ENCOUNTER — Encounter: Payer: Self-pay | Admitting: Allergy and Immunology

## 2022-01-17 NOTE — Telephone Encounter (Signed)
Please inform Whitney Roberts that we can give her a short course of prednisone at 10 mg a day for 10 days which will probably help her ear.  However, this has been a longstanding problem for her in the past and it is quite possible that her ear issue will come back within a short period in time.  She is allergic and some of this may be secondary to her allergic disease especially her tree pollen allergy.  She has seen a ENT doctor 15 years ago and it may be worthwhile to revisit with ENT to check the pressures across her tympanic membrane to see if there is the need for ear ventilation tube placement. ?

## 2022-02-13 ENCOUNTER — Other Ambulatory Visit: Payer: Self-pay

## 2022-02-15 ENCOUNTER — Ambulatory Visit: Payer: BC Managed Care – PPO | Admitting: Cardiology

## 2022-02-15 ENCOUNTER — Encounter: Payer: Self-pay | Admitting: Cardiology

## 2022-02-15 ENCOUNTER — Encounter: Payer: Self-pay | Admitting: Allergy and Immunology

## 2022-02-15 ENCOUNTER — Ambulatory Visit: Payer: BC Managed Care – PPO | Admitting: Allergy and Immunology

## 2022-02-15 VITALS — BP 172/78 | HR 88 | Ht 68.6 in | Wt 236.2 lb

## 2022-02-15 VITALS — BP 148/88 | HR 88 | Resp 18

## 2022-02-15 DIAGNOSIS — E669 Obesity, unspecified: Secondary | ICD-10-CM

## 2022-02-15 DIAGNOSIS — J069 Acute upper respiratory infection, unspecified: Secondary | ICD-10-CM

## 2022-02-15 DIAGNOSIS — I10 Essential (primary) hypertension: Secondary | ICD-10-CM | POA: Diagnosis not present

## 2022-02-15 NOTE — Patient Instructions (Signed)
Medication Instructions:  ?Your physician recommends that you continue on your current medications as directed. Please refer to the Current Medication list given to you today. ? ?*If you need a refill on your cardiac medications before your next appointment, please call your pharmacy* ? ? ?Lab Work: ?NONE ?If you have labs (blood work) drawn today and your tests are completely normal, you will receive your results only by: ?MyChart Message (if you have MyChart) OR ?A paper copy in the mail ?If you have any lab test that is abnormal or we need to change your treatment, we will call you to review the results. ? ? ?Testing/Procedures: ?NONE ? ? ?Follow-Up: ?At [facility], you and your health needs are our priority.  As part of our continuing mission to provide you with exceptional heart care, we have created designated Provider Care Teams.  These Care Teams include your primary Cardiologist (physician) and Advanced Practice Providers (APPs -  Physician Assistants and Nurse Practitioners) who all work together to provide you with the care you need, when you need it. ? ?We recommend signing up for the patient portal called "MyChart".  Sign up information is provided on this After Visit Summary.  MyChart is used to connect with patients for Virtual Visits (Telemedicine).  Patients are able to view lab/test results, encounter notes, upcoming appointments, etc.  Non-urgent messages can be sent to your provider as well.   ?To learn more about what you can do with MyChart, go to [email or web address].   ? ?Your next appointment:   ?  ? ?The format for your next appointment:   ?In Person ? ?Provider:   ?Belva Crome, MD  ? ? ?Other Instructions ? ? ?Important Information About Sugar ? ? ? ? ?  ?

## 2022-02-15 NOTE — Patient Instructions (Signed)
?  1.  Continue Allergen avoidance measures - dust mite, cat, dog, pollen ? ?2.  Continue  FLONASE SENSIMIST 1-2 sprays each nostril 3-7 times per week  ? ?3.  Continue to Treat reflux / LPR: ? ? A. Omeprazole 40 mg - 2 times per day ? ?4. If needed: ? ? A.  Nasal saline ? B.  Loratadine 10 mg - 1 tablet 1-2 times per day ? C.  Positive pressure inflation of ear ? ?5. Return to clinic in 1 year or earlier if problem ?

## 2022-02-15 NOTE — Progress Notes (Signed)
?Cardiology Office Note:   ? ?Date:  02/15/2022  ? ?ID:  Whitney Roberts, DOB 1960/11/09, MRN [id number] ? ?PCP:  Hamrick, Durward Fortes, MD  ?Cardiologist:  Garwin Brothers, MD  ? ?Referring MD: Philemon Kingdom, MD  ? ? ?ASSESSMENT:   ? ?1. Essential hypertension   ?2. Obesity (BMI 35.0-39.9 without comorbidity)   ? ?PLAN:   ? ?In order of problems listed above: ? ?Primary prevention stressed with the patient.  Importance of compliance with diet medication stressed and she vocalized understanding.  She was advised to walk at least half an hour a day 5 days a week and she promises to do so. ?Essential hypertension: Blood pressure stable and diet was emphasized.  She has an element of whitecoat hypertension.  Her blood pressure readings are fine at home. ?Obesity: Weight reduction stressed diet emphasized.  Risks of obesity explained and she promises to do better with this. ?Lipids will be followed by primary care.  Diet emphasized.  She will be seen in follow-up appointment on a as needed basis only.  Patient had multiple questions which were answered to her satisfaction. ? ? ?Medication Adjustments/Labs and Tests Ordered: ?Current medicines are reviewed at length with the patient today.  Concerns regarding medicines are outlined above.  ?No orders of the defined types were placed in this encounter. ? ?No orders of the defined types were placed in this encounter. ? ? ? ?No chief complaint on file. ?  ? ?History of Present Illness:   ? ?Whitney Roberts is a 61 y.o. female.  Patient has past medical history of essential hypertension and obesity.  She was evaluated for restratification.  Calcium score is 0 and she is happy about it.  She is walking on a regular basis.  No chest pain orthopnea or PND.  At the time of my evaluation, the patient is alert awake oriented and in no distress. ? ?Past Medical History:  ?Diagnosis Date  ? Aortic atherosclerosis (HCC)   ? Chest pain 12/14/2021  ? Cramp and spasm   ? Essential  hypertension 11/14/2021  ? Fatty liver   ? Herpes zoster 06/29/2016  ? Obesity (BMI 35.0-39.9 without comorbidity) 12/14/2021  ? Palpitations   ? Peptic ulcer disease   ? Postmenopausal bleeding 11/14/2021  ? Prediabetes 11/14/2021  ? Urticaria   ? Vasovagal syncope 11/14/2021  ? ? ?Past Surgical History:  ?Procedure Laterality Date  ? TUBAL LIGATION    ? ? ?Current Medications: ?Current Meds  ?Medication Sig  ? Ascorbic Acid (VITAMIN C PO) Take 1 tablet by mouth daily.  ? Cholecalciferol (VITAMIN D3 PO) Take 3,000 Units by mouth daily.  ? CREAM BASE EX Apply 1 application topically daily. Bi-Est/Pro (50/50) 1/100mg /gm Cream  ? fluticasone (FLONASE SENSIMIST) 27.5 MCG/SPRAY nasal spray Place 2 sprays into the nose daily as needed for allergies or rhinitis.  ? hydrochlorothiazide (HYDRODIURIL) 12.5 MG tablet Take 12.5 mg by mouth daily.  ? olmesartan (BENICAR) 40 MG tablet Take 40 mg by mouth daily.  ? omeprazole (PRILOSEC) 40 MG capsule Take one capsule by mouth twice daily as directed.  ? Probiotic Product (PROBIOTIC DAILY PO) Take 1 capsule by mouth daily.  ?  ? ?Allergies:   Penicillins, Aspirin, Dymista [azelastine-fluticasone], Montelukast, and Prednisone  ? ?Social History  ? ?Socioeconomic History  ? Marital status: Married  ?  Spouse name: Not on file  ? Number of children: Not on file  ? Years of education: Not on file  ? Highest  education level: Not on file  ?Occupational History  ? Not on file  ?Tobacco Use  ? Smoking status: Never  ? Smokeless tobacco: Never  ?Substance and Sexual Activity  ? Alcohol use: Not Currently  ? Drug use: Never  ? Sexual activity: Not on file  ?Other Topics Concern  ? Not on file  ?Social History Narrative  ? Not on file  ? ?Social Determinants of Health  ? ?Financial Resource Strain: Not on file  ?Food Insecurity: Not on file  ?Transportation Needs: Not on file  ?Physical Activity: Not on file  ?Stress: Not on file  ?Social Connections: Not on file  ?  ? ?Family History: ?The  patient's family history includes COPD in her father; Heart disease in her mother. ? ?ROS:   ?Please see the history of present illness.    ?All other systems reviewed and are negative. ? ?EKGs/Labs/Other Studies Reviewed:   ? ?The following studies were reviewed today: ?I discussed my findings with the patient at length. ? ? ?Recent Labs: ?No results found for requested labs within last 8760 hours.  ?Recent Lipid Panel ?No results found for: CHOL, TRIG, HDL, CHOLHDL, VLDL, LDLCALC, LDLDIRECT ? ?Physical Exam:   ? ?VS:  BP (!) 172/78   Pulse 88   Ht 5' 8.6" (1.742 m)   Wt 236 lb 3.2 oz (107.1 kg)   SpO2 97%   BMI 35.29 kg/m?    ? ?Wt Readings from Last 3 Encounters:  ?02/15/22 236 lb 3.2 oz (107.1 kg)  ?01/08/22 242 lb (109.8 kg)  ?12/14/21 237 lb 3.2 oz (107.6 kg)  ?  ? ?GEN: Patient is in no acute distress ?HEENT: Normal ?NECK: No JVD; No carotid bruits ?LYMPHATICS: No lymphadenopathy ?CARDIAC: Hear sounds regular, 2/6 systolic murmur at the apex. ?RESPIRATORY:  Clear to auscultation without rales, wheezing or rhonchi  ?ABDOMEN: Soft, non-tender, non-distended ?MUSCULOSKELETAL:  No edema; No deformity  ?SKIN: Warm and dry ?NEUROLOGIC:  Alert and oriented x 3 ?PSYCHIATRIC:  Normal affect  ? ?Signed, ?Garwin Brothers, MD  ?02/15/2022 2:36 PM    ?[facility]  ?

## 2022-02-15 NOTE — Progress Notes (Signed)
Whitney Roberts presents to this clinic in evaluation of cough.  I last saw her in this clinic on 08 January 2022 at which point in time she was seen for allergic rhinitis and ETD and LPR and overall was doing quite well with her therapy.  Unfortunately, about 7 days ago she developed acute onset of cough after shopping at [facility] without a mask.  She has had unrelenting cough that is disturbing her sleep without any other respiratory tract symptoms.  She does not have any upper airway symptoms, anosmia, ugly nasal discharge, irritation of her throat, sore throat, raspy voice, chest pain, wheezing, she should shortness of breath or chest tightness.  She has not had a fever.  She started Coricidin over-the-counter medication and has been taking Allegra-D and this is really helping her significantly and in fact over the course of the past 24 hours she is tremendously improved and has had very little cough today.  She continues to treat her reflux with omeprazole twice a day and she continues to treat her atopic upper airway disease while using Flonase most days of the week.  I have informed Denyse that she should continue to show improvements assuming that this is a viral respiratory tract infection and we are not going to have her undergo any further evaluation or treatment for this issue unless of course that is persistent or recurrent. ?

## 2022-02-18 ENCOUNTER — Encounter: Payer: Self-pay | Admitting: Allergy and Immunology

## 2022-03-22 ENCOUNTER — Encounter: Payer: Self-pay | Admitting: Cardiology

## 2022-03-22 ENCOUNTER — Other Ambulatory Visit: Payer: Self-pay | Admitting: *Deleted

## 2022-03-22 ENCOUNTER — Encounter: Payer: Self-pay | Admitting: Allergy and Immunology

## 2022-03-22 MED ORDER — OMEPRAZOLE 40 MG PO CPDR: DELAYED_RELEASE_CAPSULE | ORAL | 5 refills | Status: DC

## 2022-03-22 NOTE — Telephone Encounter (Signed)
Pt requested omeprazole rx via mychart. Submitted PA through cover my meds with this outcome: "our PA has been resolved, no additional PA is required. For further inquiries please contact the number on the back of the member prescription card".  Called pharmacy and copay is $16.

## 2022-03-23 ENCOUNTER — Encounter: Payer: Self-pay | Admitting: Allergy and Immunology

## 2022-03-29 ENCOUNTER — Encounter: Payer: Self-pay | Admitting: Allergy and Immunology

## 2022-04-02 ENCOUNTER — Other Ambulatory Visit: Payer: Self-pay

## 2022-04-02 ENCOUNTER — Encounter: Payer: Self-pay | Admitting: Allergy and Immunology

## 2022-04-02 MED ORDER — OLOPATADINE HCL 0.6 % NA SOLN: NASAL | 10 refills | Status: DC

## 2022-04-23 ENCOUNTER — Encounter: Payer: Self-pay | Admitting: Allergy and Immunology

## 2022-05-14 NOTE — Telephone Encounter (Signed)
Patient is scheduled for NP appt with Dr. Lucie Leather.

## 2022-09-30 ENCOUNTER — Other Ambulatory Visit: Payer: Self-pay | Admitting: Allergy and Immunology

## 2022-10-10 ENCOUNTER — Ambulatory Visit: Payer: BC Managed Care – PPO | Admitting: Allergy and Immunology

## 2022-12-17 ENCOUNTER — Other Ambulatory Visit: Payer: Self-pay | Admitting: Cardiology

## 2022-12-17 DIAGNOSIS — R079 Chest pain, unspecified: Secondary | ICD-10-CM

## 2023-01-03 IMAGING — CT CT CARDIAC CORONARY ARTERY CALCIUM SCORE
3 series · 14 of 20 positions shown, 16 images · non-contrast
Comparison: None.

Addendum:
CLINICAL DATA: Cardiovascular Disease Risk stratification

EXAM:
Coronary Calcium Score
TECHNIQUE: A gated, non-contrast computed tomography scan of the heart was
performed using 3mm slice thickness. Axial images were analyzed on a
dedicated workstation. Calcium scoring of the coronary arteries was
performed using the Agatston method.

[Series 3: cascseq 2.0 b35f 70% · axial · 0.39mm/px · z∈[+1323,+1405]mm · 4 of 69 slices shown]
[im 14/69  vessel]
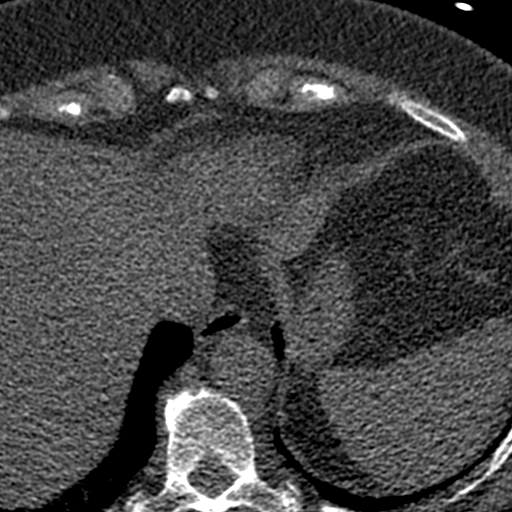
[im 28/69  vessel]
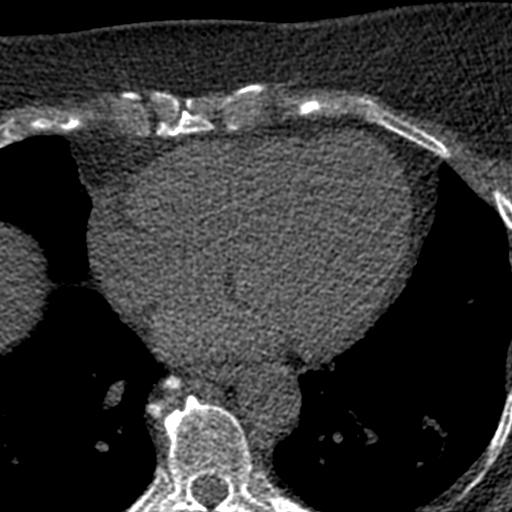
[im 41/69  vessel]
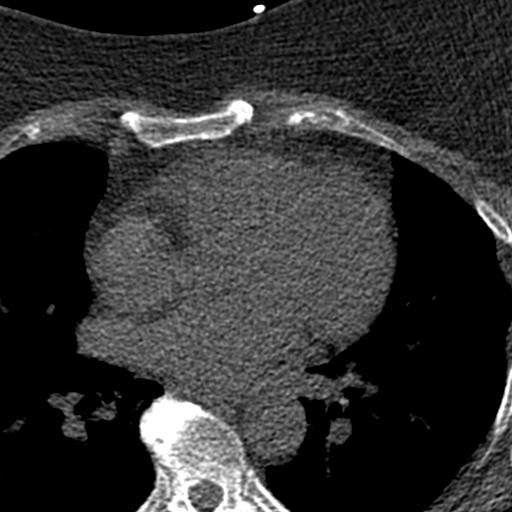
[im 55/69  vessel]
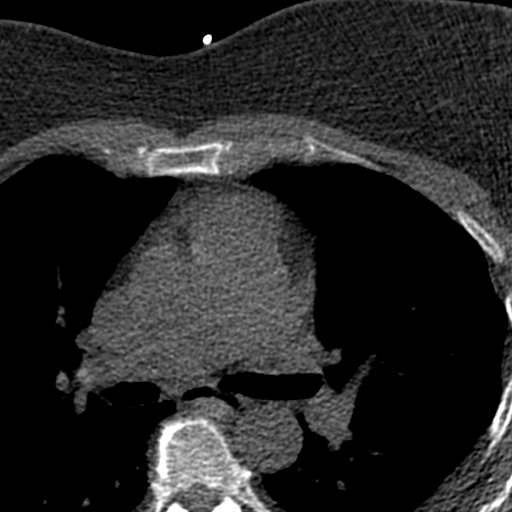

[Series 4: ax st full fov · axial · 0.39mm/px · z∈[+1319,+1409]mm · 5 of 69 slices shown, 7 images (1 of 2)]
[im 12/69  vessel]
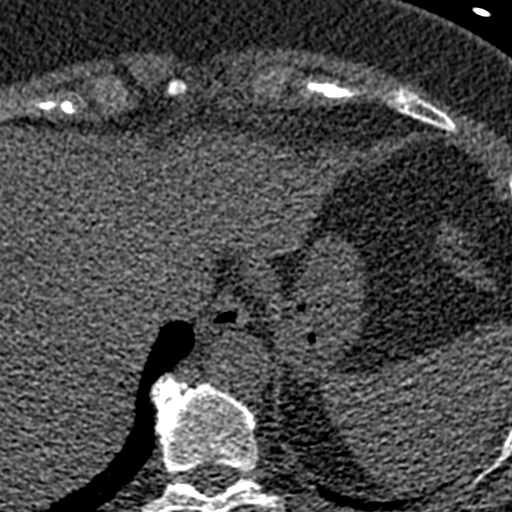
[im 12/69  lung]
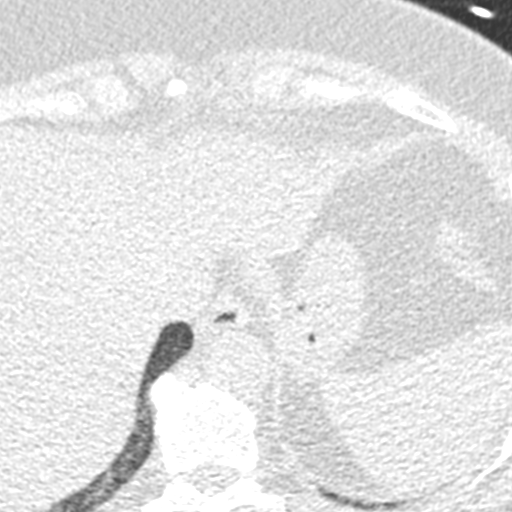
[im 23/69  vessel]
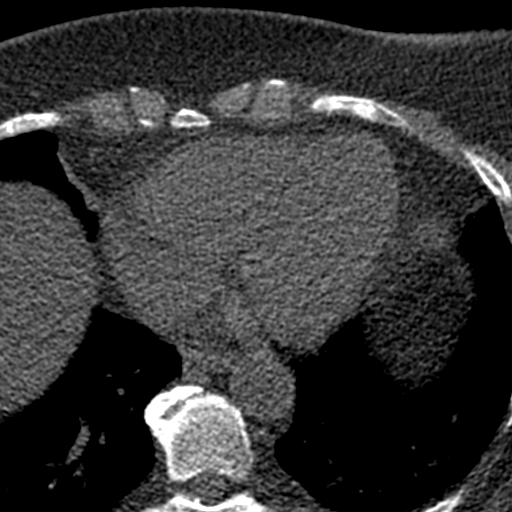
[im 35/69  vessel]
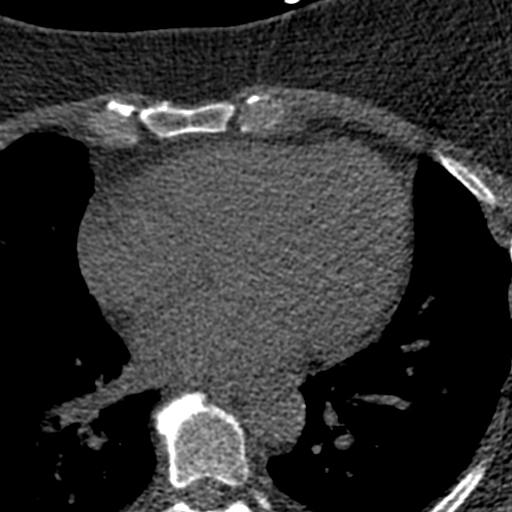
[im 46/69  vessel]
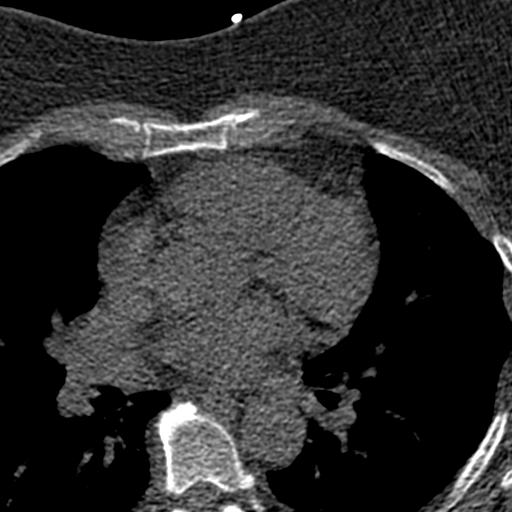
[im 57/69  vessel]
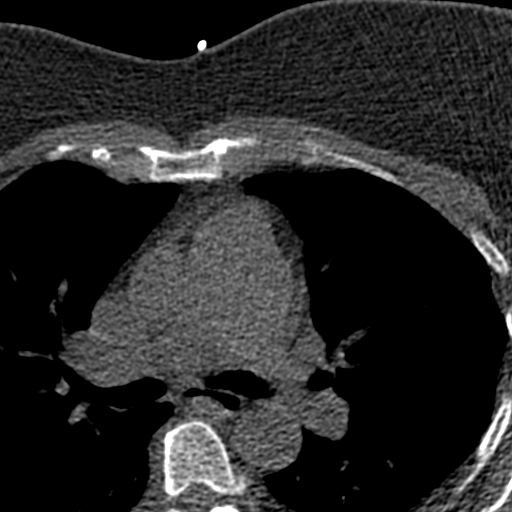
[im 57/69  lung]
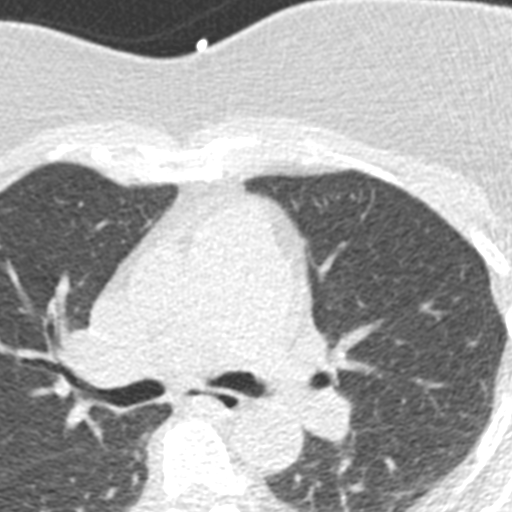

[Series 6: ax st full fov · axial · 0.94mm/px · z∈[+1319,+1409]mm · 5 of 69 slices shown (2 of 2)]
[im 12/69  vessel]
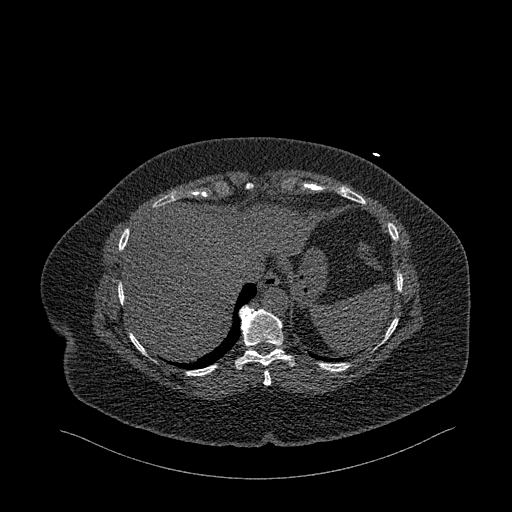
[im 23/69  vessel]
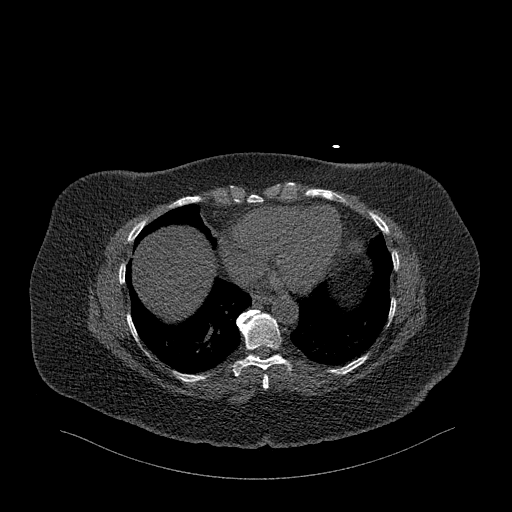
[im 35/69  vessel]
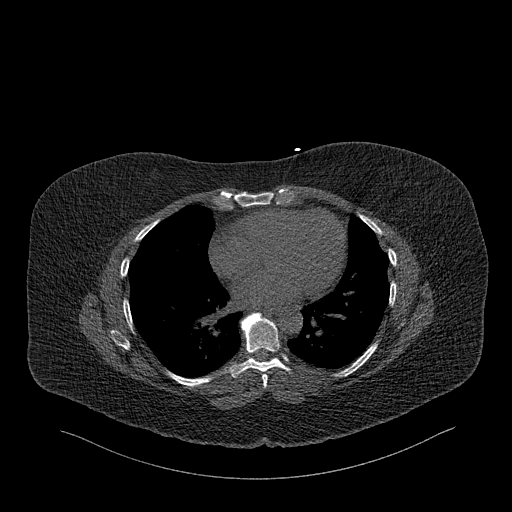
[im 46/69  vessel]
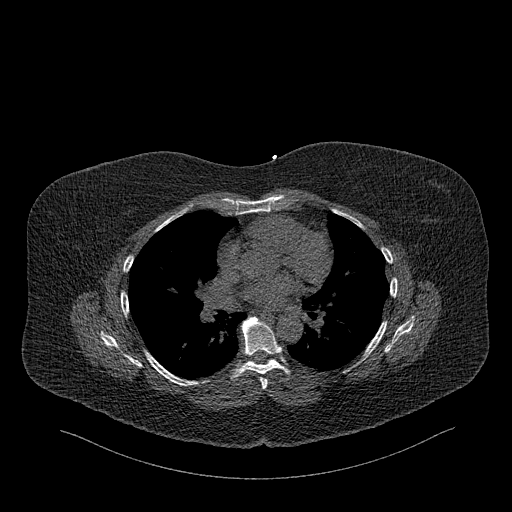
[im 57/69  vessel]
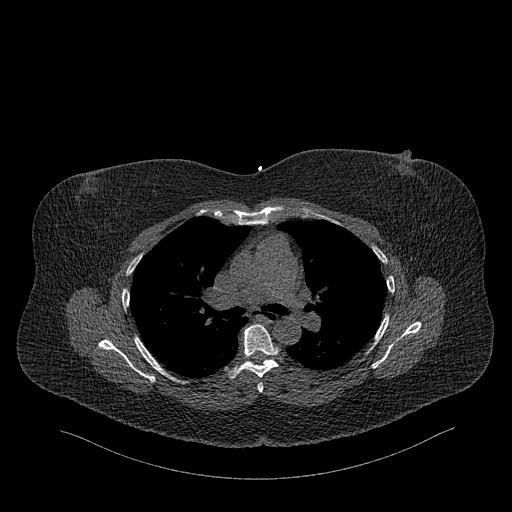

[14 of 20 positions shown; findings below may reference images not displayed]

FINDINGS: Technically difficult study

Coronary Calcium Score:

Left main: 0

Left anterior descending artery: 0

Left circumflex artery: 0

Right coronary artery: 0

Total: 0

Percentile: 0

Pericardium: Normal.

Ascending Aorta: Normal caliber.

Non-cardiac: See separate report from [REDACTED].
IMPRESSION: Coronary calcium score of 0. This was 0 percentile for age-, race-,
and sex-matched controls.



If CAC=0, it is reasonable to withhold statin therapy and reassess
in 5 to 10 years, as long as higher risk conditions are absent
(diabetes mellitus, family history of premature CHD in first degree
relatives (males <55 years; females <65 years), cigarette smoking,
or LDL >=190 mg/dL).

If CAC is 1 to 99, it is reasonable to initiate statin therapy for
patients >=55 years of age.

If CAC is >=100 or >=75th percentile, it is reasonable to initiate
statin therapy at any age.

Cardiology referral should be considered for patients with CAC
scores >=400 or >=75th percentile.

*3062 AHA/ACC/AACVPR/AAPA/ABC/JUMPER/ETHIO/ROKEYA BEGUM/[place]/SINE/DELOWR/HELLO
Guideline on the Management of Blood Cholesterol: A Report of the
American College of Cardiology/American Heart Association Task Force
on Clinical Practice Guidelines. J Am Coll Cardiol.
7267;73(24):6070-6831.

EXAM:
OVER-READ INTERPRETATION  CT CHEST

The following report is an over-read performed by radiologist Dr.
over-read does not include interpretation of cardiac or coronary
anatomy or pathology. The coronary calcium score interpretation by
the cardiologist is attached.
FINDINGS: No mediastinal mass or adenopathy identified.

No pleural effusion identified. No airspace consolidation,
atelectasis or pneumothorax. No suspicious lung nodules identified.

No acute abnormality within the imaged portions of the upper
abdomen.

Degenerative disc disease identified within the thoracic spine. No
acute or suspicious osseous findings.
IMPRESSION: No significant noncardiac supplemental findings identified.

*** End of Addendum ***
FINDINGS: Technically difficult study

Coronary Calcium Score:

Left main: 0

Left anterior descending artery: 0

Left circumflex artery: 0

Right coronary artery: 0

Total: 0

Percentile: 0

Pericardium: Normal.

Ascending Aorta: Normal caliber.

Non-cardiac: See separate report from [REDACTED].
IMPRESSION: Coronary calcium score of 0. This was 0 percentile for age-, race-,
and sex-matched controls.



If CAC=0, it is reasonable to withhold statin therapy and reassess
in 5 to 10 years, as long as higher risk conditions are absent
(diabetes mellitus, family history of premature CHD in first degree
relatives (males <55 years; females <65 years), cigarette smoking,
or LDL >=190 mg/dL).

If CAC is 1 to 99, it is reasonable to initiate statin therapy for
patients >=55 years of age.

If CAC is >=100 or >=75th percentile, it is reasonable to initiate
statin therapy at any age.

Cardiology referral should be considered for patients with CAC
scores >=400 or >=75th percentile.

*3062 AHA/ACC/AACVPR/AAPA/ABC/JUMPER/ETHIO/ROKEYA BEGUM/[place]/SINE/DELOWR/HELLO
Guideline on the Management of Blood Cholesterol: A Report of the
American College of Cardiology/American Heart Association Task Force
on Clinical Practice Guidelines. J Am Coll Cardiol.
7267;73(24):6070-6831.

## 2023-01-09 ENCOUNTER — Encounter: Payer: Self-pay | Admitting: Allergy and Immunology

## 2023-01-09 ENCOUNTER — Ambulatory Visit: Payer: BC Managed Care – PPO | Admitting: Allergy and Immunology

## 2023-01-09 VITALS — BP 138/96 | HR 84 | Resp 16 | Ht 67.0 in | Wt 229.0 lb

## 2023-01-09 DIAGNOSIS — J301 Allergic rhinitis due to pollen: Secondary | ICD-10-CM | POA: Diagnosis not present

## 2023-01-09 DIAGNOSIS — J3089 Other allergic rhinitis: Secondary | ICD-10-CM

## 2023-01-09 DIAGNOSIS — K219 Gastro-esophageal reflux disease without esophagitis: Secondary | ICD-10-CM

## 2023-01-09 NOTE — Patient Instructions (Addendum)
  1.  Continue Allergen avoidance measures - dust mite, cat, dog, pollen  2.  Continue to Treat reflux / LPR:   A. Omeprazole 40 mg - 1-2 times per day  3. If needed:   A.  Nasal saline  C.  Olopatadine nasal spray  B.  Loratadine 10 mg - 1 tablet 1-2 times per day  4. Return to clinic in 1 year or earlier if problem

## 2023-01-09 NOTE — Progress Notes (Unsigned)
[place]   Follow-up Note  Referring Provider: Ernestene Kiel, MD Primary Provider: Leonides Sake, MD Date of Office Visit: 01/09/2023  Subjective:   Whitney Roberts (DOB: [birth year]) is a 62 y.o. female who returns to the [facility] on 01/09/2023 in re-evaluation of the following:  HPI: Whitney Roberts returns to this clinic in evaluation of allergic rhinitis, ETD, LPR.  I last saw her in this clinic 08 January 2022.  At this point she thinks that she is doing very well regarding her upper airway and her ear issue and her reflux/throat issue while she continues to use omeprazole just 1 time per day at this point and continues on olopatadine nasal spray and an antihistamine.  She is very satisfied with the response that she has received while utilizing this plan and it does not sound as though she is required a systemic steroid for an or an antibiotic for any type of airway issue since her last visit.  She has had no classic reflux symptoms on her omeprazole.  She informs me that she is going through grieving process as is the rest of her family as her grandson who is a [employer] student was murdered about 8 months ago.  Allergies as of 01/09/2023       Reactions   Penicillins Hives   Aspirin Other (See Comments)   Other reaction(s): GI Bleed "bleeding ulcers'   Dymista [azelastine-fluticasone] Other (See Comments)   Increased heart rate   Montelukast Other (See Comments)   Ringing in ears   Prednisone Other (See Comments)   Trouble thinking        Medication List    amLODipine 2.5 MG tablet Commonly known as: NORVASC Take 2.5 mg by mouth daily.   loratadine 10 MG tablet Commonly known as: CLARITIN Take 10 mg by mouth daily as needed.   olmesartan 40 MG tablet Commonly known as: BENICAR Take 40 mg by mouth daily.   Olopatadine HCl 0.6 % Soln Can use one spray in each nostril twice daily if needed.    omeprazole 40 MG capsule Commonly known as: PRILOSEC TAKE 1 CAPSULE BY MOUTH TWICE DAILY AS DIRECTED   PROBIOTIC DAILY PO Take 1 capsule by mouth daily.   rosuvastatin 5 MG tablet Commonly known as: CRESTOR Take 5 mg by mouth at bedtime.   VITAMIN C PO Take 1 tablet by mouth daily.   VITAMIN D3 PO Take 3,000 Units by mouth daily.        Past Medical History:  Diagnosis Date   Aortic atherosclerosis ([place])    Chest pain 12/14/2021   Cramp and spasm    Essential hypertension 11/14/2021   Fatty liver    Herpes zoster 06/29/2016   Obesity (BMI 35.0-39.9 without comorbidity) 12/14/2021   Palpitations    Peptic ulcer disease    Postmenopausal bleeding 11/14/2021   Prediabetes 11/14/2021   Urticaria    Vasovagal syncope 11/14/2021    Past Surgical History:  Procedure Laterality Date   TUBAL LIGATION      Review of systems negative except as noted in HPI / PMHx or noted below:  Review of Systems  Constitutional: Negative.   HENT: Negative.    Eyes: Negative.   Respiratory: Negative.    Cardiovascular: Negative.   Gastrointestinal: Negative.   Genitourinary: Negative.   Musculoskeletal: Negative.   Skin: Negative.   Neurological: Negative.   Endo/Heme/Allergies: Negative.   Psychiatric/Behavioral: Negative.  Objective:   Vitals:   01/09/23 1100 01/09/23 1130  BP: (!) 148/90 (!) 138/96  Pulse: 84   Resp: 16   SpO2: 96%    Height: '5\' 7"'$  (170.2 cm)  Weight: 229 lb (103.9 kg)   Physical Exam Constitutional:      Appearance: She is not diaphoretic.  HENT:     Head: Normocephalic.     Right Ear: Tympanic membrane, ear canal and external ear normal.     Left Ear: Tympanic membrane, ear canal and external ear normal.     Nose: Nose normal. No mucosal edema or rhinorrhea.     Mouth/Throat:     Pharynx: Uvula midline. No oropharyngeal exudate.  Eyes:     Conjunctiva/sclera: Conjunctivae normal.  Neck:     Thyroid: No thyromegaly.     Trachea:  Trachea normal. No tracheal tenderness or tracheal deviation.  Cardiovascular:     Rate and Rhythm: Normal rate and regular rhythm.     Heart sounds: Normal heart sounds, S1 normal and S2 normal. No murmur heard. Pulmonary:     Effort: No respiratory distress.     Breath sounds: Normal breath sounds. No stridor. No wheezing or rales.  Lymphadenopathy:     Head:     Right side of head: No tonsillar adenopathy.     Left side of head: No tonsillar adenopathy.     Cervical: No cervical adenopathy.  Skin:    Findings: No erythema or rash.     Nails: There is no clubbing.  Neurological:     Mental Status: She is alert.     Diagnostics: none  Assessment and Plan:   1. Perennial allergic rhinitis   2. Seasonal allergic rhinitis due to pollen   3. LPRD (laryngopharyngeal reflux disease)    1.  Continue Allergen avoidance measures - dust mite, cat, dog, pollen  2.  Continue to Treat reflux / LPR:   A. Omeprazole 40 mg - 1-2 times per day  3. If needed:   A.  Nasal saline  C.  Olopatadine nasal spray  B.  Loratadine 10 mg - 1 tablet 1-2 times per day  4. Return to clinic in 1 year or earlier if problem  Whitney Roberts has a very good understanding of her disease state and how her medications work and appropriate dosing of her medications depending on disease activity and she will continue on the plan noted above and we will see her back in this clinic in 1 year or earlier if there is a problem.  Allena Katz, MD Allergy / Immunology [place]

## 2023-01-10 ENCOUNTER — Encounter: Payer: Self-pay | Admitting: Allergy and Immunology

## 2023-03-04 ENCOUNTER — Ambulatory Visit: Payer: BC Managed Care – PPO | Admitting: Allergy and Immunology

## 2023-03-11 ENCOUNTER — Ambulatory Visit: Payer: BC Managed Care – PPO | Admitting: Allergy and Immunology

## 2023-03-11 ENCOUNTER — Encounter: Payer: Self-pay | Admitting: Allergy and Immunology

## 2023-03-11 VITALS — BP 140/82 | HR 82 | Resp 16

## 2023-03-11 DIAGNOSIS — J3089 Other allergic rhinitis: Secondary | ICD-10-CM | POA: Diagnosis not present

## 2023-03-11 DIAGNOSIS — J301 Allergic rhinitis due to pollen: Secondary | ICD-10-CM | POA: Diagnosis not present

## 2023-03-11 DIAGNOSIS — K219 Gastro-esophageal reflux disease without esophagitis: Secondary | ICD-10-CM

## 2023-03-11 DIAGNOSIS — H6993 Unspecified Eustachian tube disorder, bilateral: Secondary | ICD-10-CM

## 2023-03-11 NOTE — Progress Notes (Unsigned)
[place] [facility] [place] [facility] [place] [facility] [place] [facility] [place]   Follow-up Note  Referring Provider: Ailene Ravel, MD Primary Provider: Ailene Ravel, MD Date of Office Visit: 03/11/2023  Subjective:   Whitney Roberts (DOB: [birth year]) is a 62 y.o. female who [facility] in re-evaluation of the following:  HPI: Whitney Roberts returns to this clinic in evaluation of allergic rhinitis, ETD, LPR.  I last saw her in this clinic 09 January 2023.  She has had no problems with her airway and she has not required a systemic steroid or an antibiotic since I last seen her in this clinic and overall is doing wonderful while intermittently using olopatadine nasal spray.  And her reflux has been under excellent control on her current plan of a proton pump inhibitor.  But she has been having lots of problems with crackling in her ears.  This is an intermittent issue.  It is not accompanied by hearing loss or vertigo or ringing.  It is more like a crackling/popping sensation.  Allergies as of 03/11/2023       Reactions   Penicillins Hives   Aspirin Other (See Comments)   Other reaction(s): GI Bleed "bleeding ulcers'   Dymista [azelastine-fluticasone] Other (See Comments)   Increased heart rate   Montelukast Other (See Comments)   Ringing in ears   Prednisone Other (See Comments)   Trouble thinking        Medication List    amLODipine 2.5 MG tablet Commonly known as: NORVASC Take 2.5 mg by mouth daily.   loratadine 10 MG tablet Commonly known as: CLARITIN Take 10 mg by mouth daily as needed.   olmesartan 40 MG tablet Commonly known as: BENICAR Take 40 mg by mouth daily.   Olopatadine HCl 0.6 % Soln Can use one spray in each nostril twice daily if needed.   omeprazole 40 MG capsule Commonly known as: PRILOSEC TAKE 1 CAPSULE BY MOUTH TWICE DAILY AS DIRECTED   rosuvastatin 5 MG tablet Commonly known as: CRESTOR Take 5 mg by mouth at  bedtime.   VITAMIN D3 PO Take 3,000 Units by mouth daily.    Past Medical History:  Diagnosis Date   Aortic atherosclerosis (HCC)    Chest pain 12/14/2021   Cramp and spasm    Essential hypertension 11/14/2021   Fatty liver    Herpes zoster 06/29/2016   Obesity (BMI 35.0-39.9 without comorbidity) 12/14/2021   Palpitations    Peptic ulcer disease    Postmenopausal bleeding 11/14/2021   Prediabetes 11/14/2021   Urticaria    Vasovagal syncope 11/14/2021    Past Surgical History:  Procedure Laterality Date   TUBAL LIGATION      Review of systems negative except as noted in HPI / PMHx or noted below:  Review of Systems  Constitutional: Negative.   HENT: Negative.    Eyes: Negative.   Respiratory: Negative.    Cardiovascular: Negative.   Gastrointestinal: Negative.   Genitourinary: Negative.   Musculoskeletal: Negative.   Skin: Negative.   Neurological: Negative.   Endo/Heme/Allergies: Negative.   Psychiatric/Behavioral: Negative.       Objective:   Vitals:   03/11/23 0832  BP: (!) 140/82  Pulse: 82  Resp: 16  SpO2: 97%          Physical Exam Constitutional:      Appearance: She is not diaphoretic.  HENT:     Head: Normocephalic.     Right Ear: Tympanic  membrane, ear canal and external ear normal. Tympanic membrane has normal mobility.     Left Ear: Tympanic membrane, ear canal and external ear normal. Tympanic membrane has normal mobility.     Nose: Nose normal. No mucosal edema or rhinorrhea.     Mouth/Throat:     Pharynx: Uvula midline. No oropharyngeal exudate.  Eyes:     Conjunctiva/sclera: Conjunctivae normal.  Neck:     Thyroid: No thyromegaly.     Trachea: Trachea normal. No tracheal tenderness or tracheal deviation.  Cardiovascular:     Rate and Rhythm: Normal rate and regular rhythm.     Heart sounds: Normal heart sounds, S1 normal and S2 normal. No murmur heard. Pulmonary:     Effort: No respiratory distress.     Breath sounds: Normal  breath sounds. No stridor. No wheezing or rales.  Lymphadenopathy:     Head:     Right side of head: No tonsillar adenopathy.     Left side of head: No tonsillar adenopathy.     Cervical: No cervical adenopathy.  Skin:    Findings: No erythema or rash.     Nails: There is no clubbing.  Neurological:     Mental Status: She is alert.     Diagnostics: none  Assessment and Plan:   1. Perennial allergic rhinitis   2. Seasonal allergic rhinitis due to pollen   3. ETD (Eustachian tube dysfunction), bilateral   4. LPRD (laryngopharyngeal reflux disease)    1.  Continue Allergen avoidance measures - dust mite, cat, dog, pollen  2. Treat and prevent inflammation of eustachian tube:   A. OTC Rhinocort / budesonide - 1-2 sprays each nostril 3-7 times per week  3.  Continue to Treat reflux / LPR:   A. Omeprazole 40 mg - 1-2 times per day  4. If needed:   A.  Nasal saline  B.  Olopatadine nasal spray  C.  Loratadine 10 mg - 1 tablet 1-2 times per day  D.  Positive pressure inflation of eustachian tubes  5. Further evaluation of ETD??? ENT???  6. Return to clinic in 1 year or earlier if problem  [name] appears to have some ETD and she does not do well with the plan noted above which includes the introduction of a nasal steroid and some positive pressure inflation of her eustachian tubes and we may need to be to refer her onto ENT.  She has had some problems with nasal sprays in the past giving rise to headaches and hopefully that will not be the case with nasal budesonide.  If she does well I will see her back in this clinic in 1 year.  [name], MD Allergy / Immunology [facility]

## 2023-03-11 NOTE — Patient Instructions (Addendum)
  1.  Continue Allergen avoidance measures - dust mite, cat, dog, pollen  2. Treat and prevent inflammation of eustachian tube:   A. OTC Rhinocort / budesonide - 1-2 sprays each nostril 3-7 times per week  3.  Continue to Treat reflux / LPR:   A. Omeprazole 40 mg - 1-2 times per day  4. If needed:   A.  Nasal saline  B.  Olopatadine nasal spray  C.  Loratadine 10 mg - 1 tablet 1-2 times per day  D.  Positive pressure inflation of eustachian tubes  5. Further evaluation of ETD??? ENT???  6. Return to clinic in 1 year or earlier if problem

## 2023-03-12 ENCOUNTER — Encounter: Payer: Self-pay | Admitting: Allergy and Immunology

## 2023-04-23 ENCOUNTER — Encounter: Payer: Self-pay | Admitting: Allergy and Immunology

## 2023-04-24 ENCOUNTER — Other Ambulatory Visit: Payer: Self-pay

## 2023-04-24 MED ORDER — OMEPRAZOLE 40 MG PO CPDR: DELAYED_RELEASE_CAPSULE | ORAL | 10 refills | Status: DC

## 2023-06-19 ENCOUNTER — Encounter: Payer: Self-pay | Admitting: Allergy and Immunology

## 2023-06-19 ENCOUNTER — Ambulatory Visit: Payer: BC Managed Care – PPO | Admitting: Allergy and Immunology

## 2023-06-19 VITALS — BP 144/88 | HR 84 | Resp 16 | Ht 66.5 in | Wt 235.0 lb

## 2023-06-19 DIAGNOSIS — J3089 Other allergic rhinitis: Secondary | ICD-10-CM | POA: Diagnosis not present

## 2023-06-19 DIAGNOSIS — R2681 Unsteadiness on feet: Secondary | ICD-10-CM

## 2023-06-19 DIAGNOSIS — H6993 Unspecified Eustachian tube disorder, bilateral: Secondary | ICD-10-CM

## 2023-06-19 DIAGNOSIS — J301 Allergic rhinitis due to pollen: Secondary | ICD-10-CM

## 2023-06-19 DIAGNOSIS — K219 Gastro-esophageal reflux disease without esophagitis: Secondary | ICD-10-CM | POA: Diagnosis not present

## 2023-06-19 NOTE — Patient Instructions (Addendum)
  1.  Continue Allergen avoidance measures - dust mite, cat, dog, pollen  2. Treat and prevent inflammation of eustachian tube:   A. OTC Rhinocort / budesonide - 1-2 sprays each nostril 3-7 times per week  3.  Continue to Treat reflux / LPR:   A. Omeprazole 40 mg - 1-2 times per day  4. If needed:   A.  Nasal saline  B.  Olopatadine nasal spray  C.  Loratadine 10 mg - 1 tablet 1-2 times per day  D.  Positive pressure inflation of eustachian tubes  5. For the unsteadiness issue:   A. Stop pravastatin x 1 month. Does this help?  B. If stopping pravastatin does not help, then ENT evaluation  6. Return to clinic in 1 year or earlier if problem  7. Plan for fall flu vaccine  8. BP=160/94

## 2023-06-19 NOTE — Progress Notes (Unsigned)
[place] - [place] - [place] - [place] - [place]   Follow-up Note  Referring Provider: Ailene Ravel, MD Primary Provider: Ailene Ravel, MD Date of Office Visit: 06/19/2023  Subjective:   Whitney Roberts (DOB: Sep 19, 1961) is a 62 y.o. female who [facility] in re-evaluation of the following:  HPI: Whitney Roberts returns to this clinic in evaluation of allergic rhinitis, ETD, LPR.  I last saw her in this clinic 11 Mar 2023.  Whitney Roberts's issue at this point in time is that she just feels unsteady.  It is not a true vertigo and it is not dizziness and there is not a positional quality.  It is not accompanied by any respiratory tract symptoms or ear issues.  Specifically, she does not have any hearing loss or crackles or ringing in her ears.  This issue does appear to correlate with starting pravastatin on 14 April 2023.  Within a month of starting the medication she has started this issue with this unsteadiness.  Her airway issue is going just wonderful while using a nasal steroid.  Her reflux is under excellent control while using omeprazole mostly once a day.  Allergies as of 06/19/2023       Reactions   Penicillins Hives   Aspirin Other (See Comments)   Other reaction(s): GI Bleed "bleeding ulcers'   Dymista [azelastine-fluticasone] Other (See Comments)   Increased heart rate   Montelukast Other (See Comments)   Ringing in ears   Prednisone Other (See Comments)   Trouble thinking        Medication List    amLODipine 2.5 MG tablet Commonly known as: NORVASC Take 2.5 mg by mouth daily.   loratadine 10 MG tablet Commonly known as: CLARITIN Take 10 mg by mouth daily as needed.   metaxalone 800 MG tablet Commonly known as: SKELAXIN Take 800 mg by mouth 4 (four) times daily as needed.   olmesartan 40 MG tablet Commonly known as: BENICAR Take 40 mg by mouth daily.   Olopatadine HCl 0.6 % Soln Can use one spray  in each nostril twice daily if needed.   omeprazole 40 MG capsule Commonly known as: PRILOSEC TAKE 1 CAPSULE BY MOUTH ONE TO TWO TIMES DAILY AS DIRECTED   pravastatin 20 MG tablet Commonly known as: PRAVACHOL Take 20 mg by mouth at bedtime.   VITAMIN D3 PO Take 3,000 Units by mouth daily.     Past Medical History:  Diagnosis Date   Aortic atherosclerosis (HCC)    Chest pain 12/14/2021   Cramp and spasm    Essential hypertension 11/14/2021   Fatty liver    Herpes zoster 06/29/2016   Obesity (BMI 35.0-39.9 without comorbidity) 12/14/2021   Palpitations    Peptic ulcer disease    Postmenopausal bleeding 11/14/2021   Prediabetes 11/14/2021   Urticaria    Vasovagal syncope 11/14/2021    Past Surgical History:  Procedure Laterality Date   TUBAL LIGATION      Review of systems negative except as noted in HPI / PMHx or noted below:  Review of Systems  Constitutional: Negative.   HENT: Negative.    Eyes: Negative.   Respiratory: Negative.    Cardiovascular: Negative.   Gastrointestinal: Negative.   Genitourinary: Negative.   Musculoskeletal: Negative.   Skin: Negative.   Neurological: Negative.   Endo/Heme/Allergies: Negative.   Psychiatric/Behavioral: Negative.       Objective:   Vitals:   06/19/23 4540 06/19/23 9811  BP: (!) 160/94 (!) 144/88  Pulse: 84   Resp: 16   SpO2: 95%    Height: 5' 6.5" (168.9 cm)  Weight: 235 lb (106.6 kg)   Physical Exam Constitutional:      Appearance: She is not diaphoretic.  HENT:     Head: Normocephalic.     Right Ear: Tympanic membrane, ear canal and external ear normal.     Left Ear: Tympanic membrane, ear canal and external ear normal.     Nose: Nose normal. No mucosal edema or rhinorrhea.     Mouth/Throat:     Pharynx: Uvula midline. No oropharyngeal exudate.  Eyes:     Conjunctiva/sclera: Conjunctivae normal.  Neck:     Thyroid: No thyromegaly.     Trachea: Trachea normal. No tracheal tenderness or tracheal  deviation.  Cardiovascular:     Rate and Rhythm: Normal rate and regular rhythm.     Heart sounds: Normal heart sounds, S1 normal and S2 normal. No murmur heard. Pulmonary:     Effort: No respiratory distress.     Breath sounds: Normal breath sounds. No stridor. No wheezing or rales.  Lymphadenopathy:     Head:     Right side of head: No tonsillar adenopathy.     Left side of head: No tonsillar adenopathy.     Cervical: No cervical adenopathy.  Skin:    Findings: No erythema or rash.     Nails: There is no clubbing.  Neurological:     Mental Status: She is alert.     Diagnostics: none  Assessment and Plan:   1. Perennial allergic rhinitis   2. Seasonal allergic rhinitis due to pollen   3. ETD (Eustachian tube dysfunction), bilateral   4. LPRD (laryngopharyngeal reflux disease)   5. Unsteadiness    1.  Continue Allergen avoidance measures - dust mite, cat, dog, pollen  2. Treat and prevent inflammation of eustachian tube:   A. OTC Rhinocort / budesonide - 1-2 sprays each nostril 3-7 times per week  3.  Continue to Treat reflux / LPR:   A. Omeprazole 40 mg - 1-2 times per day  4. If needed:   A.  Nasal saline  B.  Olopatadine nasal spray  C.  Loratadine 10 mg - 1 tablet 1-2 times per day  D.  Positive pressure inflation of eustachian tubes  5. For the unsteadiness issue:   A. Stop pravastatin x 1 month. Does this help?  B. If stopping pravastatin does not help, then ENT evaluation  6. Return to clinic in 1 year or earlier if problem  7. Plan for fall flu vaccine  8. BP=160/94  Whitney Roberts is doing very well from a respiratory standpoint and from a reflux standpoint on her current therapy and she has a very good idea about her disease state and how her medications work and appropriate dosing of her medications depending on disease activity.  She has unsteadiness and there appears to be a loose temporal relationship between starting her pravastatin and developing  this unsteadiness and we will discontinue her pravastatin for a month and if this unsteadiness resolves then I will ask her to restart her pravastatin and see if this unsteadiness comes back.  If she does not resolve her unsteadiness while stopping pravastatin then she will need to see ENT.  [name], MD Allergy / Immunology [facility]

## 2023-06-20 ENCOUNTER — Encounter: Payer: Self-pay | Admitting: Allergy and Immunology

## 2023-07-01 ENCOUNTER — Encounter: Payer: Self-pay | Admitting: Allergy and Immunology

## 2023-07-04 ENCOUNTER — Other Ambulatory Visit: Payer: Self-pay | Admitting: Allergy and Immunology

## 2023-07-04 DIAGNOSIS — R2681 Unsteadiness on feet: Secondary | ICD-10-CM

## 2023-08-26 ENCOUNTER — Institutional Professional Consult (permissible substitution) (INDEPENDENT_AMBULATORY_CARE_PROVIDER_SITE_OTHER): Payer: BC Managed Care – PPO | Admitting: Otolaryngology

## 2023-08-26 NOTE — Telephone Encounter (Signed)
Patient called in - she attempted to arrive to her appointment with [facility], patient was unable to find office. Patient is requesting referral be sent somewhere closer to home, [place].    Called [facility], scheduled patient for 11-04-2023 at 9:10am. Sent patient mychart message to patient with updated information.

## 2023-10-04 ENCOUNTER — Encounter: Payer: Self-pay | Admitting: Allergy and Immunology

## 2023-10-04 ENCOUNTER — Other Ambulatory Visit: Payer: Self-pay | Admitting: *Deleted

## 2023-10-04 MED ORDER — OLOPATADINE HCL 0.6 % NA SOLN: NASAL | 5 refills | Status: AC

## 2023-11-01 ENCOUNTER — Encounter: Payer: Self-pay | Admitting: Allergy and Immunology

## 2024-01-07 ENCOUNTER — Ambulatory Visit (INDEPENDENT_AMBULATORY_CARE_PROVIDER_SITE_OTHER)

## 2024-01-07 ENCOUNTER — Ambulatory Visit: Payer: Self-pay | Admitting: Podiatry

## 2024-01-07 DIAGNOSIS — M7661 Achilles tendinitis, right leg: Secondary | ICD-10-CM | POA: Diagnosis not present

## 2024-01-07 DIAGNOSIS — M7751 Other enthesopathy of right foot: Secondary | ICD-10-CM | POA: Diagnosis not present

## 2024-01-07 MED ORDER — DICLOFENAC SODIUM 1 % EX GEL: 2.0000 g | Freq: Four times a day (QID) | CUTANEOUS | 2 refills | Status: AC

## 2024-01-07 NOTE — Progress Notes (Unsigned)
 Chief Complaint  Patient presents with   Achillis Injury    NP with recurring achillis pain. 13 years ago she "ruptured" her achillis but was corrected with aggressive PT. Flared up about 2 weeks ago. She did start wearing her boot again which helped some, and good arch support helps some and it is not as sore as it was. Not diabetic and no anti coag.     HPI: 63 y.o. female presents today with right posterior heel pain.  States that this really began about 2 weeks ago.  She has history of old Achilles injury 13 years ago which she managed to rehab.  States it feels similar to this.  She does have a boot at home which she has become wearing again and that this does help some.  Past Medical History:  Diagnosis Date   Aortic atherosclerosis (HCC)    Chest pain 12/14/2021   Cramp and spasm    Essential hypertension 11/14/2021   Fatty liver    Herpes zoster 06/29/2016   Obesity (BMI 35.0-39.9 without comorbidity) 12/14/2021   Palpitations    Peptic ulcer disease    Postmenopausal bleeding 11/14/2021   Prediabetes 11/14/2021   Urticaria    Vasovagal syncope 11/14/2021    Past Surgical History:  Procedure Laterality Date   TUBAL LIGATION      Allergies  Allergen Reactions   Penicillins Hives   Aspirin Other (See Comments)    Other reaction(s): GI Bleed "bleeding ulcers'    Dymista [Azelastine-Fluticasone] Other (See Comments)    Increased heart rate   Montelukast Other (See Comments)    Ringing in ears   Prednisone Other (See Comments)    Trouble thinking    ROS denies any nausea, vomiting, fever, chills, chest pain, shortness of breath   Physical Exam: There were no vitals filed for this visit.  General: The patient is alert and oriented x3 in no acute distress.  Dermatology: Skin is warm, dry and supple bilateral lower extremities. Interspaces are clear of maceration and debris.    Vascular: Palpable pedal pulses bilaterally. Capillary refill within normal  limits.  No appreciable edema.  No erythema or calor.  Neurological: Light touch sensation grossly intact bilateral feet.   Musculoskeletal Exam: Tenderness on palpation to right Achilles in the watershed area and just proximal to insertion.  No pain on palpation of the insertion site.  Some equinus noted.  Radiographic Exam: Right ankle radiographs 01/07/2024 Normal osseous mineralization. Joint spaces preserved.  No fractures or osseous irregularities noted.  Significant calcaneal spurring posterior aspect with spurring to plantar tubercle as well.  Assessment/Plan of Care: 1. Achilles tendinitis of right lower extremity      Meds ordered this encounter  Medications   diclofenac Sodium (VOLTAREN ARTHRITIS PAIN) 1 % GEL    Sig: Apply 2 g topically 4 (four) times daily.    Dispense:  150 g    Refill:  2   AMB REFERRAL TO PHYSICAL THERAPY  Discussed clinical findings with patient today.  Suspect Achilles midsubstance tendinitis.  Continue cam boot immobilization with heel lifts.  Prescribing Voltaren gel to be applied 4 times a day with massaged into the Achilles region.  Avoiding oral NSAIDs due to history of gastric bleeds.  PT referral placed.  Home stretching regimen discussed with patient in the interim.  Utilize RICE therapy.  Follow-up in 2 weeks   Aahan Marques L. Marchia Bond, AACFAS [facility]  [address] [place], [place] [address]                Office [phone]  Fax [phone]

## 2024-01-10 ENCOUNTER — Encounter: Payer: Self-pay | Admitting: Podiatry

## 2024-01-21 ENCOUNTER — Ambulatory Visit: Admitting: Podiatry

## 2024-01-21 DIAGNOSIS — M7661 Achilles tendinitis, right leg: Secondary | ICD-10-CM | POA: Diagnosis not present

## 2024-01-21 NOTE — Progress Notes (Unsigned)
 Chief Complaint  Patient presents with   Achillies    Here today for follow up for the right achillis tendonitis. She states she is doing much better. She has been doing the stretches and using the cream. She states the heel feels so much better.  Not diabetic and no Anti coag.     HPI: 63 y.o. female presents today following up for right Achilles tendinitis.  She reports that she has been doing quite a bit better.  She has not heard from physical therapy at this point.  She has been keeping up with stretching exercises and using the walking boot predominantly.  She does present with regular shoe gear today since she had to drive to the appointment.  Past Medical History:  Diagnosis Date   Aortic atherosclerosis (HCC)    Chest pain 12/14/2021   Cramp and spasm    Essential hypertension 11/14/2021   Fatty liver    Herpes zoster 06/29/2016   Obesity (BMI 35.0-39.9 without comorbidity) 12/14/2021   Palpitations    Peptic ulcer disease    Postmenopausal bleeding 11/14/2021   Prediabetes 11/14/2021   Urticaria    Vasovagal syncope 11/14/2021    Past Surgical History:  Procedure Laterality Date   TUBAL LIGATION      Allergies  Allergen Reactions   Penicillins Hives   Aspirin Other (See Comments)    Other reaction(s): GI Bleed "bleeding ulcers'    Dymista [Azelastine-Fluticasone] Other (See Comments)    Increased heart rate   Montelukast Other (See Comments)    Ringing in ears   Prednisone Other (See Comments)    Trouble thinking    ROS denies any nausea, vomiting, fever, chills, chest pain, shortness of breath   Physical Exam: There were no vitals filed for this visit.  General: The patient is alert and oriented x3 in no acute distress.  Dermatology: Skin is warm, dry and supple bilateral lower extremities. Interspaces are clear of maceration and debris.    Vascular: Palpable pedal pulses bilaterally. Capillary refill within normal limits.  No appreciable  edema.  No erythema or calor.  Neurological: Light touch sensation grossly intact bilateral feet.   Musculoskeletal Exam: Mild tenderness on palpation to right Achilles in the watershed area and just proximal to insertion.  No pain on palpation of the insertion site.  Some equinus noted.  Radiographic Exam: Right ankle radiographs 01/07/2024 Normal osseous mineralization. Joint spaces preserved.  No fractures or osseous irregularities noted.  Significant calcaneal spurring posterior aspect with spurring to plantar tubercle as well.  Assessment/Plan of Care: 1. Achilles tendinitis of right lower extremity      No orders of the defined types were placed in this encounter.  None  Discussed clinical findings with patient today.   Can slowly progress out of the cam boot and into regular shoes over the next couple weeks using heel lifts in the shoes.  New PT referral placed.  Patient to continue home stretching regimen.  Utilize RICE therapy.  She can continue over-the-counter diclofenac gel to the right Achilles area of maximal tenderness 3-4 times a day massaged over several minutes.  Follow-up in 3 weeks   Flemon Kelty L. Marchia Bond, AACFAS [facility]     [address]  [place], [place] [address]                Office [phone]  Fax [phone]

## 2024-01-22 ENCOUNTER — Encounter: Payer: Self-pay | Admitting: Podiatry

## 2024-01-29 ENCOUNTER — Telehealth: Payer: Self-pay | Admitting: Podiatry

## 2024-01-29 NOTE — Telephone Encounter (Signed)
 Patient has called twice and [facility] stated never received fax for rehabilitation from Dr. Jamse Arn, Patient is getting frustrated and would like to get assistance due to her ankle starting to heal. Patient contact telephone, [phone]

## 2024-02-04 ENCOUNTER — Telehealth: Payer: Self-pay | Admitting: Podiatry

## 2024-02-04 NOTE — Telephone Encounter (Signed)
 2nd time she has stated calling to check the status of referral for PT. Please call Pt w/update. TY

## 2024-02-11 ENCOUNTER — Ambulatory Visit (INDEPENDENT_AMBULATORY_CARE_PROVIDER_SITE_OTHER): Admitting: Podiatry

## 2024-02-11 DIAGNOSIS — Z91199 Patient's noncompliance with other medical treatment and regimen due to unspecified reason: Secondary | ICD-10-CM

## 2024-02-16 NOTE — Progress Notes (Signed)
   Complete physical exam  Patient: Whitney Roberts   DOB: 08/18/2000   63 y.o. Female  MRN: [id number]  Subjective:    No chief complaint on file.   Whitney Roberts is a 63 y.o. female who presents today for a complete physical exam. She reports consuming a {diet types:17450} diet. {types:19826} She generally feels {DESC; WELL/FAIRLY WELL/POORLY:18703}. She reports sleeping {DESC; WELL/FAIRLY WELL/POORLY:18703}. She {does/does not:200015} have additional problems to discuss today.    Most recent fall risk assessment:    04/25/2022   10:42 AM  Fall Risk   Falls in the past year? 0  Number falls in past yr: 0  Injury with Fall? 0  Risk for fall due to : No Fall Risks  Follow up Falls evaluation completed     Most recent depression screenings:    04/25/2022   10:42 AM 03/16/2021   10:46 AM  PHQ 2/9 Scores  PHQ - 2 Score 0 0  PHQ- 9 Score 5     {VISON DENTAL STD PSA (Optional):27386}  {History (Optional):23778}  Patient Care Team: Christen Butter, NP as PCP - General (Nurse Practitioner)   Outpatient Medications Prior to Visit  Medication Sig   fluticasone (FLONASE) 50 MCG/ACT nasal spray Place 2 sprays into both nostrils in the morning and at bedtime. After 7 days, reduce to once daily.   norgestimate-ethinyl estradiol (SPRINTEC 28) 0.25-35 MG-MCG tablet Take 1 tablet by mouth daily.   Nystatin POWD Apply liberally to affected area 2 times per day   spironolactone (ALDACTONE) 100 MG tablet Take 1 tablet (100 mg total) by mouth daily.   No facility-administered medications prior to visit.    ROS        Objective:     There were no vitals taken for this visit. {Vitals History (Optional):23777}  Physical Exam   No results found for any visits on 05/31/22. {Show previous labs (optional):23779}    Assessment & Plan:    Routine Health Maintenance and Physical Exam  Immunization History  Administered Date(s) Administered   DTaP 11/01/1999, 12/28/1999,  03/07/2000, 11/21/2000, 06/06/2004   Hepatitis A 04/02/2008, 04/08/2009   Hepatitis B 08/19/1999, 09/26/1999, 03/07/2000   HiB (PRP-OMP) 11/01/1999, 12/28/1999, 03/07/2000, 11/21/2000   IPV 11/01/1999, 12/28/1999, 08/26/2000, 06/06/2004   Influenza,inj,Quad PF,6+ Mos 07/09/2014   Influenza-Unspecified 10/08/2012   MMR 08/26/2001, 06/06/2004   Meningococcal Polysaccharide 04/07/2012   Pneumococcal Conjugate-13 11/21/2000   Pneumococcal-Unspecified 03/07/2000, 05/21/2000   Tdap 04/07/2012   Varicella 08/26/2000, 04/02/2008    Health Maintenance  Topic Date Due   HIV Screening  Never done   Hepatitis C Screening  Never done   INFLUENZA VACCINE  05/29/2022   PAP-Cervical Cytology Screening  05/31/2022 (Originally 08/17/2020)   PAP SMEAR-Modifier  05/31/2022 (Originally 08/17/2020)   TETANUS/TDAP  05/31/2022 (Originally 04/07/2022)   HPV VACCINES  Discontinued   COVID-19 Vaccine  Discontinued    Discussed health benefits of physical activity, and encouraged her to engage in regular exercise appropriate for her age and condition.  Problem List Items Addressed This Visit   None Visit Diagnoses     Annual physical exam    -  Primary   Cervical cancer screening       Need for Tdap vaccination          No follow-ups on file.     Christen Butter, NP

## 2024-04-01 ENCOUNTER — Other Ambulatory Visit: Payer: Self-pay | Admitting: Allergy and Immunology

## 2024-05-13 ENCOUNTER — Encounter: Payer: Self-pay | Admitting: Allergy and Immunology

## 2024-05-13 ENCOUNTER — Other Ambulatory Visit: Payer: Self-pay

## 2024-05-13 MED ORDER — OMEPRAZOLE 20 MG PO CPDR: DELAYED_RELEASE_CAPSULE | ORAL | 0 refills | Status: DC

## 2024-06-01 ENCOUNTER — Encounter: Payer: Self-pay | Admitting: Allergy and Immunology

## 2024-06-01 ENCOUNTER — Ambulatory Visit: Admitting: Allergy and Immunology

## 2024-06-01 VITALS — BP 150/88 | HR 76 | Resp 16 | Ht 66.3 in | Wt 230.4 lb

## 2024-06-01 DIAGNOSIS — J3089 Other allergic rhinitis: Secondary | ICD-10-CM | POA: Diagnosis not present

## 2024-06-01 DIAGNOSIS — J301 Allergic rhinitis due to pollen: Secondary | ICD-10-CM | POA: Diagnosis not present

## 2024-06-01 DIAGNOSIS — H6993 Unspecified Eustachian tube disorder, bilateral: Secondary | ICD-10-CM

## 2024-06-01 DIAGNOSIS — K219 Gastro-esophageal reflux disease without esophagitis: Secondary | ICD-10-CM | POA: Diagnosis not present

## 2024-06-01 NOTE — Progress Notes (Unsigned)
 [place] - [place] - [place] - [place] - [place]   Follow-up Note  Referring Provider: Stephanie Charlene CROME, MD Primary Provider: Stephanie Charlene CROME, MD Date of Office Visit: 06/01/2024  Subjective:   Whitney Roberts (DOB: 1961/08/30) is a 63 y.o. female who [facility]24 in re-evaluation of the following:  HPI: Whitney Roberts returns to this clinic in evaluation of allergic rhinitis, ETD, LPR.  I last saw her in this clinic 19 June 2023.  She has done very well since have last seen in this clinic regarding her allergic rhinitis and she has not required a systemic steroid or an antibiotic for any type of airway issue and she rarely uses any nasal steroids and occasionally uses some antihistamines and other types of medications for symptoms related to intermittent nasal congestion and sometimes feeling as though her ears are stopped up.  She did have significant ETD during her last visit and had this unsteadiness and fortunately that appears to have resolved.  She does have a history of vertigo in the distant past but that has not presented itself in years.  She believes that her reflux is doing very well at this point in time.  She has made some behavioral modifications regarding food consumption.  She is now down to using omeprazole  20 mg 1 time per day.  Sometimes when she used 40 mg of omeprazole  it did upset her stomach on occasion.  Allergies as of 06/01/2024       Reactions   Penicillins Hives   Aspirin Other (See Comments)   Other reaction(s): GI Bleed bleeding ulcers'   Dymista [azelastine-fluticasone] Other (See Comments)   Increased heart rate   Montelukast Other (See Comments)   Ringing in ears   Prednisone Other (See Comments)   Trouble thinking        Medication List    amLODipine 2.5 MG tablet Commonly known as: NORVASC Take 2.5 mg by mouth daily.   loratadine 10 MG tablet Commonly known as: CLARITIN Take 10 mg  by mouth daily as needed.   metaxalone 800 MG tablet Commonly known as: SKELAXIN Take 800 mg by mouth 4 (four) times daily as needed.   olmesartan 40 MG tablet Commonly known as: BENICAR Take 40 mg by mouth daily.   Olopatadine  HCl 0.6 % Soln Can use one spray in each nostril twice daily if needed.   omeprazole  20 MG capsule Commonly known as: PRILOSEC Take one tablet by mouth one to two times daily as directed.   pravastatin 10 MG tablet Commonly known as: PRAVACHOL Take 10 mg by mouth at bedtime.   VITAMIN D3 PO Take 3,000 Units by mouth daily.    Past Medical History:  Diagnosis Date   Aortic atherosclerosis (HCC)    Chest pain 12/14/2021   Cramp and spasm    Essential hypertension 11/14/2021   Fatty liver    Herpes zoster 06/29/2016   Obesity (BMI 35.0-39.9 without comorbidity) 12/14/2021   Palpitations    Peptic ulcer disease    Postmenopausal bleeding 11/14/2021   Prediabetes 11/14/2021   Urticaria    Vasovagal syncope 11/14/2021    Past Surgical History:  Procedure Laterality Date   TUBAL LIGATION      Review of systems negative except as noted in HPI / PMHx or noted below:  Review of Systems  Constitutional: Negative.   HENT: Negative.    Eyes: Negative.   Respiratory: Negative.    Cardiovascular: Negative.  Gastrointestinal: Negative.   Genitourinary: Negative.   Musculoskeletal: Negative.   Skin: Negative.   Neurological: Negative.   Endo/Heme/Allergies: Negative.   Psychiatric/Behavioral: Negative.       Objective:   Vitals:   06/01/24 0904  BP: (!) 148/92  Pulse: 76  Resp: 16  SpO2: 96%   Height: 5' 6.3 (168.4 cm)  Weight: 230 lb 6.4 oz (104.5 kg)   Physical Exam Constitutional:      Appearance: She is not diaphoretic.  HENT:     Head: Normocephalic.     Right Ear: Tympanic membrane, ear canal and external ear normal.     Left Ear: Tympanic membrane, ear canal and external ear normal.     Nose: Nose normal. No mucosal  edema or rhinorrhea.     Mouth/Throat:     Pharynx: Uvula midline. No oropharyngeal exudate.  Eyes:     Conjunctiva/sclera: Conjunctivae normal.  Neck:     Thyroid: No thyromegaly.     Trachea: Trachea normal. No tracheal tenderness or tracheal deviation.  Cardiovascular:     Rate and Rhythm: Normal rate and regular rhythm.     Heart sounds: Normal heart sounds, S1 normal and S2 normal. No murmur heard. Pulmonary:     Effort: No respiratory distress.     Breath sounds: Normal breath sounds. No stridor. No wheezing or rales.  Lymphadenopathy:     Head:     Right side of head: No tonsillar adenopathy.     Left side of head: No tonsillar adenopathy.     Cervical: No cervical adenopathy.  Skin:    Findings: No erythema or rash.     Nails: There is no clubbing.  Neurological:     Mental Status: She is alert.     Diagnostics: none  Assessment and Plan:   1. Perennial allergic rhinitis   2. Seasonal allergic rhinitis due to pollen   3. LPRD (laryngopharyngeal reflux disease)   4. ETD (Eustachian tube dysfunction), bilateral    1.  Continue Allergen avoidance measures - dust mite, cat, dog, pollen  2. Continue to Treat reflux / LPR:   A. Omeprazole  20 mg - 1-2 times per day  3. If needed:   A.  Nasal saline  B.  Olopatadine  nasal spray  C.  Loratadine 10 mg - 1 tablet 1-2 times per day  D.  Positive pressure inflation of eustachian tubes  E.  OTC Rhinocort / budesonide - 1-2 sprays each nostril 1-7 times per week  4. Influenza = Tamiflu. Covid = Paxlovid  5. Return to clinic in 1 year or earlier if problem  Overall Whitney Roberts is doing very well on her current plan of utilizing medications on an as-needed basis directed against inflammation of her airway and her reflux and LPR under very good control while using a relatively low dose of omeprazole .  I have refilled all of her medications and we will see her back in this clinic in 1 year or earlier if there is a problem with  this plan.  Whitney Denis, MD Allergy / Immunology [facility]

## 2024-06-01 NOTE — Patient Instructions (Addendum)
  1.  Continue Allergen avoidance measures - dust mite, cat, dog, pollen  2. Continue to Treat reflux / LPR:   A. Omeprazole  20 mg - 1-2 times per day  3. If needed:   A.  Nasal saline  B.  Olopatadine  nasal spray  C.  Loratadine 10 mg - 1 tablet 1-2 times per day  D.  Positive pressure inflation of eustachian tubes  E.  OTC Rhinocort / budesonide - 1-2 sprays each nostril 1-7 times per week  4. Influenza = Tamiflu. Covid = Paxlovid  5. Return to clinic in 1 year or earlier if problem

## 2024-06-02 ENCOUNTER — Encounter: Payer: Self-pay | Admitting: Allergy and Immunology

## 2024-10-20 ENCOUNTER — Encounter: Payer: Self-pay | Admitting: Allergy and Immunology

## 2024-10-20 ENCOUNTER — Other Ambulatory Visit: Payer: Self-pay

## 2024-10-20 MED ORDER — OMEPRAZOLE 20 MG PO CPDR: DELAYED_RELEASE_CAPSULE | ORAL | 5 refills | Status: AC
